# Patient Record
Sex: Female | Born: 1970 | Race: Black or African American | Hispanic: No | Marital: Married | Smoking: Current every day smoker
Health system: Southern US, Community
[De-identification: ages and names within clinical notes are randomized; demographics above are authoritative.]

## PROBLEM LIST (undated history)

## (undated) DIAGNOSIS — I10 Essential (primary) hypertension: Secondary | ICD-10-CM

## (undated) DIAGNOSIS — E876 Hypokalemia: Secondary | ICD-10-CM

## (undated) HISTORY — PX: GASTRIC BYPASS: SHX52

---

## 1997-04-25 ENCOUNTER — Ambulatory Visit (HOSPITAL_COMMUNITY): Admission: RE | Admit: 1997-04-25 | Discharge: 1997-04-25 | Payer: Self-pay | Admitting: *Deleted

## 1997-06-11 ENCOUNTER — Ambulatory Visit (HOSPITAL_COMMUNITY): Admission: RE | Admit: 1997-06-11 | Discharge: 1997-06-11 | Payer: Self-pay | Admitting: *Deleted

## 1997-08-15 ENCOUNTER — Ambulatory Visit (HOSPITAL_COMMUNITY): Admission: RE | Admit: 1997-08-15 | Discharge: 1997-08-15 | Payer: Self-pay | Admitting: *Deleted

## 1997-10-13 ENCOUNTER — Inpatient Hospital Stay (HOSPITAL_COMMUNITY): Admission: AD | Admit: 1997-10-13 | Discharge: 1997-10-13 | Payer: Self-pay | Admitting: *Deleted

## 1997-10-13 ENCOUNTER — Inpatient Hospital Stay (HOSPITAL_COMMUNITY): Admission: AD | Admit: 1997-10-13 | Discharge: 1997-10-19 | Payer: Self-pay | Admitting: *Deleted

## 2016-08-10 ENCOUNTER — Ambulatory Visit (INDEPENDENT_AMBULATORY_CARE_PROVIDER_SITE_OTHER): Payer: Medicaid Other

## 2016-08-10 ENCOUNTER — Ambulatory Visit (HOSPITAL_COMMUNITY)
Admission: EM | Admit: 2016-08-10 | Discharge: 2016-08-10 | Disposition: A | Discharge status: Home / Self Care | Payer: Medicaid Other | Attending: Internal Medicine | Admitting: Internal Medicine

## 2016-08-10 ENCOUNTER — Encounter (HOSPITAL_COMMUNITY): Payer: Self-pay | Admitting: Emergency Medicine

## 2016-08-10 DIAGNOSIS — S46912A Strain of unspecified muscle, fascia and tendon at shoulder and upper arm level, left arm, initial encounter: Secondary | ICD-10-CM

## 2016-08-10 DIAGNOSIS — W19XXXA Unspecified fall, initial encounter: Secondary | ICD-10-CM

## 2016-08-10 HISTORY — DX: Essential (primary) hypertension: I10

## 2016-08-10 MED ORDER — KETOROLAC TROMETHAMINE 30 MG/ML IJ SOLN
INTRAMUSCULAR | Status: AC
  Filled 2016-08-10: qty 1

## 2016-08-10 MED ORDER — KETOROLAC TROMETHAMINE 30 MG/ML IJ SOLN
30.0000 mg | Freq: Once | INTRAMUSCULAR | Status: AC
  Administered 2016-08-10: 30 mg via INTRAMUSCULAR

## 2016-08-10 MED ORDER — CYCLOBENZAPRINE HCL 10 MG PO TABS: 10.0000 mg | ORAL_TABLET | Freq: Two times a day (BID) | ORAL | 0 refills | Status: DC | PRN

## 2016-08-10 NOTE — ED Provider Notes (Signed)
[facility]    CSN: [id number] Arrival date & time: 08/10/16  1016     History   Chief Complaint Chief Complaint  Patient presents with  . Shoulder Pain    HPI Dana Morse is a 46 y.o. female.   Pt fell yesterday onto left shoulder.  Now has difficulty moving left arm without pain. Also difficulty turning head to the left. Denies numbness or tingling in neck, arm or hand.       Past Medical History:  Diagnosis Date  . Hypertension     There are no active problems to display for this patient.   History reviewed. No pertinent surgical history.  OB History    No data available       Home Medications    Prior to Admission medications   Medication Sig Start Date End Date Taking? Authorizing Provider  BuPROPion HCl (WELLBUTRIN PO) Take by mouth.   Yes [provider]  HYDROCHLOROTHIAZIDE PO Take by mouth.   Yes [provider]  cyclobenzaprine (FLEXERIL) 10 MG tablet Take 1 tablet (10 mg total) by mouth 2 (two) times daily as needed for muscle spasms. 08/10/16   Arnaldo Nataliamond, Ariely Riddell S, MD    Family History No family history on file.  Social History Social History  Substance Use Topics  . Smoking status: Current Every Day Smoker  . Smokeless tobacco: Not on file  . Alcohol use No     Allergies   Patient has no known allergies.   Review of Systems Review of Systems  Constitutional: Negative for chills and fever.  HENT: Negative for sore throat and tinnitus.   Eyes: Negative for redness.  Respiratory: Negative for cough and shortness of breath.   Cardiovascular: Negative for chest pain and palpitations.  Gastrointestinal: Negative for abdominal pain, diarrhea, nausea and vomiting.  Genitourinary: Negative for dysuria, frequency and urgency.  Musculoskeletal: Positive for neck stiffness. Negative for myalgias.  Skin: Negative for rash.       No lesions  Neurological: Negative for weakness.  Hematological: Does not  bruise/bleed easily.  Psychiatric/Behavioral: Negative for suicidal ideas.     Physical Exam Triage Vital Signs ED Triage Vitals  Enc Vitals Group     BP 08/10/16 1111 (!) 153/89     Pulse Rate 08/10/16 1111 83     Resp 08/10/16 1111 18     Temp 08/10/16 1111 99 F (37.2 C)     Temp Source 08/10/16 1111 Oral     SpO2 08/10/16 1111 100 %     Weight --      Height --      Head Circumference --      Peak Flow --      Pain Score 08/10/16 1106 9     Pain Loc --      Pain Edu? --      Excl. in GC? --    No data found.   Updated Vital Signs BP (!) 153/89 (BP Location: Right Arm)   Pulse 83   Temp 99 F (37.2 C) (Oral)   Resp 18   SpO2 100%   Visual Acuity Right Eye Distance:   Left Eye Distance:   Bilateral Distance:    Right Eye Near:   Left Eye Near:    Bilateral Near:     Physical Exam  Constitutional: She is oriented to person, place, and time. She appears well-developed and well-nourished. No distress.  HENT:  Head: Normocephalic and atraumatic.  Mouth/Throat: Oropharynx is  clear and moist.  Eyes: Conjunctivae and EOM are normal. Pupils are equal, round, and reactive to light. No scleral icterus.  Neck: Normal range of motion. Neck supple. No JVD present. No tracheal deviation present. No thyromegaly present.  Cardiovascular: Normal rate, regular rhythm and normal heart sounds.  Exam reveals no gallop and no friction rub.   No murmur heard. Pulmonary/Chest: Effort normal and breath sounds normal.  Abdominal: Soft. Bowel sounds are normal. She exhibits no distension. There is no tenderness.  Musculoskeletal: Normal range of motion. She exhibits tenderness (along superior aspect of left shoulder/trapezius). She exhibits no edema.  Difficulty ABducting left arm due to pain. No crepitus  Lymphadenopathy:    She has no cervical adenopathy.  Neurological: She is alert and oriented to person, place, and time. No cranial nerve deficit.  Left grip strength normal    Skin: Skin is warm and dry.  Psychiatric: She has a normal mood and affect. Her behavior is normal. Judgment and thought content normal.  Nursing note and vitals reviewed.    UC Treatments / Results  Labs (all labs ordered are listed, but only abnormal results are displayed) Labs Reviewed - No data to display  EKG  EKG Interpretation None       Radiology No results found.  Procedures Procedures (including critical care time)  Medications Ordered in UC Medications  ketorolac (TORADOL) 30 MG/ML injection 30 mg (not administered)     Initial Impression / Assessment and Plan / UC Course  I have reviewed the triage vital signs and the nursing notes.  Pertinent labs & imaging results that were available during my care of the patient were reviewed by me and considered in my medical decision making (see chart for details).     No evidence of neurological damage. 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  Encourage physiotherapy.   Final Clinical Impressions(s) / UC Diagnoses   Final diagnoses:  Strain of left shoulder, initial encounter    New Prescriptions New Prescriptions   CYCLOBENZAPRINE (FLEXERIL) 10 MG TABLET    Take 1 tablet (10 mg total) by mouth 2 (two) times daily as needed for muscle spasms.     Arnaldo Natal, MD 08/10/16 1249

## 2016-08-10 NOTE — ED Triage Notes (Signed)
Pain in left shoulder started yesterday.  Fell from scaffolding yesterday, was pressure washing.  approx height of scaffolding was 2 feet.  Landed on ground on feet, then landed on right shoulder.  Today pain in left shoulder is much worse.  Turning head to the left is painful.  Left radial pulses 2 +.  Able to move fingers, brisk cap refill.  "hurts, but im stiff"

## 2016-08-24 ENCOUNTER — Ambulatory Visit (HOSPITAL_COMMUNITY)
Admission: EM | Admit: 2016-08-24 | Discharge: 2016-08-24 | Disposition: A | Discharge status: Home / Self Care | Payer: Medicaid Other | Attending: Family Medicine | Admitting: Family Medicine

## 2016-08-24 ENCOUNTER — Encounter (HOSPITAL_COMMUNITY): Payer: Self-pay | Admitting: *Deleted

## 2016-08-24 DIAGNOSIS — M436 Torticollis: Secondary | ICD-10-CM | POA: Diagnosis not present

## 2016-08-24 DIAGNOSIS — M5489 Other dorsalgia: Secondary | ICD-10-CM

## 2016-08-24 MED ORDER — TRIAMCINOLONE ACETONIDE 40 MG/ML IJ SUSP
INTRAMUSCULAR | Status: AC
  Filled 2016-08-24: qty 1

## 2016-08-24 MED ORDER — METHOCARBAMOL 500 MG PO TABS: 500.0000 mg | ORAL_TABLET | Freq: Two times a day (BID) | ORAL | 0 refills | Status: DC

## 2016-08-24 MED ORDER — ACETAMINOPHEN 500 MG PO TABS: 500.0000 mg | ORAL_TABLET | Freq: Four times a day (QID) | ORAL | 0 refills | Status: DC | PRN

## 2016-08-24 MED ORDER — DICLOFENAC SODIUM 75 MG PO TBEC: 75.0000 mg | DELAYED_RELEASE_TABLET | Freq: Two times a day (BID) | ORAL | 0 refills | Status: DC

## 2016-08-24 MED ORDER — BUPIVACAINE HCL (PF) 0.5 % IJ SOLN
INTRAMUSCULAR | Status: AC
  Filled 2016-08-24: qty 10

## 2016-08-24 NOTE — ED Triage Notes (Signed)
Pt  Seen  sev  Weeks  Ago  For  Neck /  Shoulder  Pain  Pt  Reports  Pain   Worse  On movement  And  Position      Pt   Reports       Neck  Pain   Is  Worse  On movement   And  Position    Pt   Denies   Any  Recent  Injury

## 2016-08-24 NOTE — ED Provider Notes (Signed)
CSN: [id number]     Arrival date & time 08/24/16  1321 History   First MD Initiated Contact with Patient 08/24/16 1403     Chief Complaint  Patient presents with  . Torticollis   (Consider location/radiation/quality/duration/timing/severity/associated sxs/prior Treatment) Dana Morse is a 46 y.o. female who [facility] with a chief complaint of neck and shoulder pain ongoing now for more than 2 weeks, was seen here in the urgent care on 08/10/2016 for a similar condition, was prescribed Flexeril with minimal relief.   The history is provided by the patient.  Back Pain  Pain location: neck and shoulder. Quality:  Aching and cramping Radiates to:  Does not radiate Pain severity:  Moderate Pain is:  Same all the time Onset quality:  Gradual Duration:  2 weeks Timing:  Constant Progression:  Worsening Chronicity:  New Context: not falling, not jumping from heights, not MCA, not MVA and not twisting   Relieved by:  Muscle relaxants and being still Worsened by:  Movement Ineffective treatments:  NSAIDs and muscle relaxants Associated symptoms: no abdominal pain, no abdominal swelling, no bladder incontinence, no bowel incontinence, no fever, no headaches, no numbness and no tingling     Past Medical History:  Diagnosis Date  . Hypertension    History reviewed. No pertinent surgical history. History reviewed. No pertinent family history. Social History  Substance Use Topics  . Smoking status: Current Every Day Smoker  . Smokeless tobacco: Not on file  . Alcohol use No   OB History    No data available     Review of Systems  Constitutional: Negative for chills and fever.  HENT: Negative.   Respiratory: Negative.   Cardiovascular: Negative.   Gastrointestinal: Negative for abdominal pain, bowel incontinence, diarrhea, nausea and vomiting.  Genitourinary: Negative for bladder incontinence.  Musculoskeletal: Positive for back pain.   Shoulder and neck pain  Skin: Negative.   Neurological: Negative for tingling, numbness and headaches.    Allergies  Patient has no known allergies.  Home Medications   Prior to Admission medications   Medication Sig Start Date End Date Taking? Authorizing Provider  acetaminophen (TYLENOL) 500 MG tablet Take 1 tablet (500 mg total) by mouth every 6 (six) hours as needed. 08/24/16   Dorena BodoKennard, Tyrelle Raczka, NP  BuPROPion HCl (WELLBUTRIN PO) Take by mouth.    [provider]  diclofenac (VOLTAREN) 75 MG EC tablet Take 1 tablet (75 mg total) by mouth 2 (two) times daily. 08/24/16   Dorena BodoKennard, Estera Ozier, NP  HYDROCHLOROTHIAZIDE PO Take by mouth.    [provider]  methocarbamol (ROBAXIN) 500 MG tablet Take 1 tablet (500 mg total) by mouth 2 (two) times daily. 08/24/16   Dorena BodoKennard, Keelynn Furgerson, NP   Meds Ordered and Administered this Visit  Medications - No data to display  BP 132/78 (BP Location: Right Arm)   Pulse 78   Temp 98.6 F (37 C) (Oral)   Resp 18   SpO2 100%  No data found.   Physical Exam  Constitutional: She is oriented to person, place, and time. She appears well-developed and well-nourished. No distress.  HENT:  Head: Normocephalic and atraumatic.  Right Ear: External ear normal.  Left Ear: External ear normal.  Eyes: Conjunctivae are normal.  Cardiovascular: Normal rate and regular rhythm.   Pulmonary/Chest: Effort normal and breath sounds normal.  Musculoskeletal:       Cervical back: She exhibits spasm.       Back:  Neurological: She is alert and oriented to person, place, and time.  Skin: Skin is warm and dry. Capillary refill takes less than 2 seconds. No rash noted. She is not diaphoretic. No erythema.  Psychiatric: She has a normal mood and affect. Her behavior is normal.  Nursing note and vitals reviewed.   Urgent Care Course     .Joint Aspiration/Arthrocentesis Date/Time: 08/24/2016 2:25 PM Performed by: Dorena Bodo Authorized by:  Mardella Layman   Consent:    Consent obtained:  Verbal   Consent given by:  Patient   Risks discussed:  Bleeding, infection, nerve damage and pain   Alternatives discussed:  Delayed treatment, alternative treatment, no treatment and referral Location:    Location:  Shoulder   Shoulder joint: trapezius muscle. Anesthesia (see MAR for exact dosages):    Anesthesia method:  Local infiltration   Local anesthetic:  Bupivacaine 0.5% w/o epi and lidocaine 2% w/o epi Procedure details:    Needle gauge:  22 G   Ultrasound guidance: no     Approach:  Posterior   Aspirate amount:  0 cc   Steroid injected: yes   Post-procedure details:    Dressing:  Adhesive bandage   Patient tolerance of procedure:  Tolerated well, no immediate complications Comments:     Trigger point injection with mix of kenalog, marcaine and lidocaine     (including critical care time)  Labs Review Labs Reviewed - No data to display  Imaging Review No results found.     MDM   1. Torticollis     Trigger point injection provided in the left trapezius muscle at the side of the spasm, given mixture of Kenalog, bupivacaine, lidocaine. Discharged home on prescription of Robaxin, and diclofenac. Follow-up with primary care, return as necessary.     Dorena Bodo, NP 08/24/16 [phone]

## 2016-08-24 NOTE — Discharge Instructions (Signed)
You have received a trigger point injection for ear pain, I switched to her muscle relaxer from Flexeril to Robaxin. Given prescription of diclofenac and Tylenol for pain. Recommend rest, ice, alternating with heat as necessary, follow-up with primary care provider if pain persists.

## 2016-09-07 ENCOUNTER — Encounter: Payer: Medicaid Other | Admitting: Internal Medicine

## 2016-11-26 DIAGNOSIS — E6609 Other obesity due to excess calories: Secondary | ICD-10-CM | POA: Diagnosis not present

## 2016-11-26 DIAGNOSIS — I10 Essential (primary) hypertension: Secondary | ICD-10-CM | POA: Diagnosis not present

## 2016-11-26 DIAGNOSIS — F339 Major depressive disorder, recurrent, unspecified: Secondary | ICD-10-CM | POA: Diagnosis not present

## 2017-02-25 DIAGNOSIS — N939 Abnormal uterine and vaginal bleeding, unspecified: Secondary | ICD-10-CM | POA: Diagnosis not present

## 2017-02-26 ENCOUNTER — Other Ambulatory Visit (HOSPITAL_COMMUNITY)
Admission: RE | Admit: 2017-02-26 | Discharge: 2017-02-26 | Disposition: A | Discharge status: Home / Self Care | Payer: BLUE CROSS/BLUE SHIELD | Source: Ambulatory Visit | Attending: Family Medicine | Admitting: Family Medicine

## 2017-02-26 ENCOUNTER — Other Ambulatory Visit: Payer: Self-pay | Admitting: Family Medicine

## 2017-02-26 DIAGNOSIS — Z124 Encounter for screening for malignant neoplasm of cervix: Secondary | ICD-10-CM | POA: Insufficient documentation

## 2017-03-05 LAB — CYTOLOGY - PAP

## 2017-05-25 DIAGNOSIS — I10 Essential (primary) hypertension: Secondary | ICD-10-CM | POA: Diagnosis not present

## 2017-05-25 DIAGNOSIS — D508 Other iron deficiency anemias: Secondary | ICD-10-CM | POA: Diagnosis not present

## 2017-05-25 DIAGNOSIS — F339 Major depressive disorder, recurrent, unspecified: Secondary | ICD-10-CM | POA: Diagnosis not present

## 2017-05-25 DIAGNOSIS — E6609 Other obesity due to excess calories: Secondary | ICD-10-CM | POA: Diagnosis not present

## 2017-09-12 ENCOUNTER — Encounter (HOSPITAL_COMMUNITY): Payer: Self-pay | Admitting: Emergency Medicine

## 2017-09-12 ENCOUNTER — Emergency Department (HOSPITAL_COMMUNITY)
Admission: EM | Admit: 2017-09-12 | Discharge: 2017-09-12 | Disposition: A | Discharge status: Home / Self Care | Payer: BLUE CROSS/BLUE SHIELD | Attending: Emergency Medicine | Admitting: Emergency Medicine

## 2017-09-12 ENCOUNTER — Emergency Department (HOSPITAL_COMMUNITY): Payer: BLUE CROSS/BLUE SHIELD

## 2017-09-12 ENCOUNTER — Other Ambulatory Visit: Payer: Self-pay

## 2017-09-12 DIAGNOSIS — R109 Unspecified abdominal pain: Secondary | ICD-10-CM | POA: Diagnosis not present

## 2017-09-12 DIAGNOSIS — R197 Diarrhea, unspecified: Secondary | ICD-10-CM | POA: Diagnosis not present

## 2017-09-12 DIAGNOSIS — F17210 Nicotine dependence, cigarettes, uncomplicated: Secondary | ICD-10-CM | POA: Insufficient documentation

## 2017-09-12 DIAGNOSIS — R1084 Generalized abdominal pain: Secondary | ICD-10-CM | POA: Diagnosis not present

## 2017-09-12 DIAGNOSIS — I10 Essential (primary) hypertension: Secondary | ICD-10-CM | POA: Insufficient documentation

## 2017-09-12 DIAGNOSIS — Z79899 Other long term (current) drug therapy: Secondary | ICD-10-CM | POA: Diagnosis not present

## 2017-09-12 DIAGNOSIS — Z9884 Bariatric surgery status: Secondary | ICD-10-CM | POA: Diagnosis not present

## 2017-09-12 DIAGNOSIS — K921 Melena: Secondary | ICD-10-CM | POA: Diagnosis not present

## 2017-09-12 DIAGNOSIS — R195 Other fecal abnormalities: Secondary | ICD-10-CM | POA: Insufficient documentation

## 2017-09-12 LAB — CBC
HCT: 33.8 % — ABNORMAL LOW (ref 36.0–46.0)
Hemoglobin: 9.9 g/dL — ABNORMAL LOW (ref 12.0–15.0)
MCH: 23.2 pg — AB (ref 26.0–34.0)
MCHC: 29.3 g/dL — ABNORMAL LOW (ref 30.0–36.0)
MCV: 79.3 fL (ref 78.0–100.0)
PLATELETS: 348 10*3/uL (ref 150–400)
RBC: 4.26 MIL/uL (ref 3.87–5.11)
RDW: 17 % — ABNORMAL HIGH (ref 11.5–15.5)
WBC: 6.8 10*3/uL (ref 4.0–10.5)

## 2017-09-12 LAB — COMPREHENSIVE METABOLIC PANEL
ALBUMIN: 3.6 g/dL (ref 3.5–5.0)
ALK PHOS: 79 U/L (ref 38–126)
ALT: 21 U/L (ref 0–44)
AST: 30 U/L (ref 15–41)
Anion gap: 9 (ref 5–15)
BUN: 10 mg/dL (ref 6–20)
CALCIUM: 9.5 mg/dL (ref 8.9–10.3)
CO2: 27 mmol/L (ref 22–32)
CREATININE: 0.87 mg/dL (ref 0.44–1.00)
Chloride: 105 mmol/L (ref 98–111)
GFR calc Af Amer: >60 mL/min (ref >60)
GFR calc non Af Amer: >60 mL/min (ref >60)
GLUCOSE: 94 mg/dL (ref 70–99)
Potassium: 3.2 mmol/L — ABNORMAL LOW (ref 3.5–5.1)
SODIUM: 141 mmol/L (ref 135–145)
Total Bilirubin: 0.5 mg/dL (ref 0.3–1.2)
Total Protein: 7.9 g/dL (ref 6.5–8.1)

## 2017-09-12 LAB — URINALYSIS, ROUTINE W REFLEX MICROSCOPIC
BILIRUBIN URINE: NEGATIVE
GLUCOSE, UA: NEGATIVE mg/dL
HGB URINE DIPSTICK: NEGATIVE
Ketones, ur: NEGATIVE mg/dL
Leukocytes, UA: NEGATIVE
Nitrite: NEGATIVE
Protein, ur: NEGATIVE mg/dL
Specific Gravity, Urine: 1.02 (ref 1.005–1.030)
pH: 6 (ref 5.0–8.0)

## 2017-09-12 LAB — I-STAT BETA HCG BLOOD, ED (MC, WL, AP ONLY): I-stat hCG, quantitative: <5 m[IU]/mL (ref <5)

## 2017-09-12 LAB — POC OCCULT BLOOD, ED: Fecal Occult Bld: NEGATIVE

## 2017-09-12 LAB — LIPASE, BLOOD: Lipase: 57 U/L — ABNORMAL HIGH (ref 11–51)

## 2017-09-12 MED ORDER — ONDANSETRON 4 MG PO TBDP: 4.0000 mg | ORAL_TABLET | Freq: Three times a day (TID) | ORAL | 0 refills | Status: DC | PRN

## 2017-09-12 MED ORDER — SODIUM CHLORIDE 0.9 % IV BOLUS
500.0000 mL | Freq: Once | INTRAVENOUS | Status: AC
  Administered 2017-09-12: 500 mL via INTRAVENOUS

## 2017-09-12 MED ORDER — HYDROMORPHONE HCL 1 MG/ML IJ SOLN
0.5000 mg | Freq: Once | INTRAMUSCULAR | Status: AC
  Administered 2017-09-12: 0.5 mg via INTRAVENOUS
  Filled 2017-09-12: qty 1

## 2017-09-12 MED ORDER — DICYCLOMINE HCL 20 MG PO TABS: 20.0000 mg | ORAL_TABLET | Freq: Two times a day (BID) | ORAL | 0 refills | Status: AC

## 2017-09-12 MED ORDER — ONDANSETRON HCL 4 MG/2ML IJ SOLN
4.0000 mg | Freq: Once | INTRAMUSCULAR | Status: AC
  Administered 2017-09-12: 4 mg via INTRAVENOUS
  Filled 2017-09-12: qty 2

## 2017-09-12 MED ORDER — IOHEXOL 300 MG/ML  SOLN
100.0000 mL | Freq: Once | INTRAMUSCULAR | Status: AC | PRN
  Administered 2017-09-12: 100 mL via INTRAVENOUS

## 2017-09-12 NOTE — Discharge Instructions (Signed)
Please read and follow all provided instructions.  Your diagnoses today include:  1. Abdominal cramps   2. Blood in stool     Tests performed today include:  Blood counts and electrolytes  Blood tests to check liver and kidney function  Blood tests to check pancreas function  Urine test to look for infection  CT scan of the abdomen -no concerning findings or explanation for your pain.  Stool test for blood -was negative  Vital signs. See below for your results today.   Medications prescribed:   Bentyl - medication for intestinal cramps and spasms   Zofran (ondansetron) - for nausea and vomiting  Take any prescribed medications only as directed.  Home care instructions:   Follow any educational materials contained in this packet.  Follow-up instructions: Please follow-up with your primary care provider in the next 3 days for further evaluation of your symptoms.    Return instructions:  SEEK IMMEDIATE MEDICAL ATTENTION IF:  The pain does not go away or becomes severe   A temperature above 101F develops   Repeated vomiting occurs (multiple episodes)   The pain becomes localized to portions of the abdomen. The right side could possibly be appendicitis. In an adult, the left lower portion of the abdomen could be colitis or diverticulitis.   Larger amounts of blood is being passed in stools or vomit (bright red or black tarry stools)   You develop chest pain, difficulty breathing, dizziness or fainting, or become confused, poorly responsive, or inconsolable (young children)  If you have any other emergent concerns regarding your health  Additional Information: Abdominal (belly) pain can be caused by many things. Your caregiver performed an examination and possibly ordered blood/urine tests and imaging (CT scan, x-rays, ultrasound). Many cases can be observed and treated at home after initial evaluation in the emergency department. Even though you are being  discharged home, abdominal pain can be unpredictable. Therefore, you need a repeated exam if your pain does not resolve, returns, or worsens. Most patients with abdominal pain don't have to be admitted to the hospital or have surgery, but serious problems like appendicitis and gallbladder attacks can start out as nonspecific pain. Many abdominal conditions cannot be diagnosed in one visit, so follow-up evaluations are very important.  Your vital signs today were: BP 133/83    Pulse 82    Temp 98.7 F (37.1 C) (Oral)    Resp 14    LMP 07/08/2015 (Approximate)    SpO2 100%  If your blood pressure (bp) was elevated above 135/85 this visit, please have this repeated by your doctor within one month. --------------

## 2017-09-12 NOTE — ED Triage Notes (Signed)
Patient to ED c/o lower-mid abdominal "burning" and cramping x 3 days with nausea and diarrhea. Patient states the cramping radiates to her back intermittently. Patient reports today seeing blood in the toilet after having bowel movement. Denies urinary symptoms, vomiting, fevers.

## 2017-09-12 NOTE — ED Notes (Signed)
Patient transported to CT 

## 2017-09-12 NOTE — ED Provider Notes (Signed)
[facility] Provider Note   CSN: [id number] Arrival date & time: 09/12/17  1158     History   Chief Complaint Chief Complaint  Patient presents with  . Abdominal Pain    HPI Dana Morse is a 47 y.o. female.  Patient with past history of gastric bypass surgery (she does not remember the specific type) presents to the emergency department with generalized abdominal cramping and "burning" pain over the past 3 days.  Pain is on the "front and back".  This is been associated with nausea but no vomiting.  She has also had diarrhea.  Patient has noted blood in the toilet water after having bowel movements.  She denies fevers, chest pain, shortness of breath.  She denies dysuria, hematuria, increased frequency urgency.  She is been taking over-the-counter medications without much improvement.  No other abdominal surgeries.  The onset of this condition was acute. The course is waxing and waning. Aggravating factors: none. Alleviating factors: none.       Past Medical History:  Diagnosis Date  . Hypertension     There are no active problems to display for this patient.   History reviewed. No pertinent surgical history.   OB History   None      Home Medications    Prior to Admission medications   Medication Sig Start Date End Date Taking? Authorizing Provider  buPROPion (WELLBUTRIN XL) 150 MG 24 hr tablet Take 150 mg by mouth daily. 08/19/17  Yes [provider]  capsicum (ZOSTRIX) 0.075 % topical cream Apply 1 application topically as needed (foot pain).   Yes [provider]  diclofenac sodium (VOLTAREN) 1 % GEL Apply 2 g topically 2 (two) times daily as needed (foot and knee pain).   Yes [provider]  hydrochlorothiazide (HYDRODIURIL) 25 MG tablet Take 25 mg by mouth daily. 08/19/17  Yes [provider]  acetaminophen (TYLENOL) 500 MG tablet Take 1 tablet (500 mg total) by mouth every 6 (six) hours as  needed. Patient not taking: Reported on 09/12/2017 08/24/16   Dorena BodoKennard, Lawrence, NP  diclofenac (VOLTAREN) 75 MG EC tablet Take 1 tablet (75 mg total) by mouth 2 (two) times daily. Patient not taking: Reported on 09/12/2017 08/24/16   Dorena BodoKennard, Lawrence, NP  methocarbamol (ROBAXIN) 500 MG tablet Take 1 tablet (500 mg total) by mouth 2 (two) times daily. Patient not taking: Reported on 09/12/2017 08/24/16   Dorena BodoKennard, Lawrence, NP    Family History No family history on file.  Social History Social History   Tobacco Use  . Smoking status: Current Every Day Smoker    Packs/day: 0.25    Types: Cigarettes  . Smokeless tobacco: Never Used  Substance Use Topics  . Alcohol use: Yes    Comment: rare  . Drug use: No     Allergies   Patient has no known allergies.   Review of Systems Review of Systems  Constitutional: Negative for fever.  HENT: Negative for rhinorrhea and sore throat.   Eyes: Negative for redness.  Respiratory: Negative for cough.   Cardiovascular: Negative for chest pain.  Gastrointestinal: Positive for abdominal pain, blood in stool, diarrhea and nausea. Negative for vomiting.  Genitourinary: Negative for dysuria.  Musculoskeletal: Negative for myalgias.  Skin: Negative for rash.  Neurological: Negative for headaches.     Physical Exam Updated Vital Signs BP 133/83   Pulse 82   Temp 98.7 F (37.1 C) (Oral)   Resp 14   LMP 07/08/2015 (  Approximate)   SpO2 100%   Physical Exam  Constitutional: She appears well-developed and well-nourished.  HENT:  Head: Normocephalic and atraumatic.  Eyes: Conjunctivae are normal. Right eye exhibits no discharge. Left eye exhibits no discharge.  Neck: Normal range of motion. Neck supple.  Cardiovascular: Normal rate, regular rhythm and normal heart sounds.  Pulmonary/Chest: Effort normal and breath sounds normal.  Abdominal: Soft. There is generalized tenderness (Generalized, mild).  Genitourinary: Rectal exam shows no  external hemorrhoid, no internal hemorrhoid and guaiac negative stool.  Neurological: She is alert.  Skin: Skin is warm and dry.  Psychiatric: She has a normal mood and affect.  Nursing note and vitals reviewed.    ED Treatments / Results  Labs (all labs ordered are listed, but only abnormal results are displayed) Labs Reviewed  LIPASE, BLOOD - Abnormal; Notable for the following components:      Result Value   Lipase 57 (*)    All other components within normal limits  COMPREHENSIVE METABOLIC PANEL - Abnormal; Notable for the following components:   Potassium 3.2 (*)    All other components within normal limits  CBC - Abnormal; Notable for the following components:   Hemoglobin 9.9 (*)    HCT 33.8 (*)    MCH 23.2 (*)    MCHC 29.3 (*)    RDW 17.0 (*)    All other components within normal limits  URINALYSIS, ROUTINE W REFLEX MICROSCOPIC  I-STAT BETA HCG BLOOD, ED ([facility], [facility], [facility] ONLY)  POC OCCULT BLOOD, ED    EKG None  Radiology Ct Abdomen Pelvis W Contrast  Result Date: 09/12/2017 CLINICAL DATA:  Lower-mid abdominal burning and cramping for 3 days with nausea and diarrhea. Cramping radiates to back intermittently. Bloody stools. EXAM: CT ABDOMEN AND PELVIS WITH CONTRAST TECHNIQUE: Multidetector CT imaging of the abdomen and pelvis was performed using the standard protocol following bolus administration of intravenous contrast. CONTRAST:  OMNIPAQUE IOHEXOL 300 MG/ML  SOLN COMPARISON:  None. FINDINGS: Lower chest: No acute abnormality. Hepatobiliary: No acute or suspicious liver abnormality is seen. Focal fatty infiltration within the LEFT liver lobe. No gallstones, gallbladder wall thickening, or biliary dilatation. Pancreas: Unremarkable. No pancreatic ductal dilatation or surrounding inflammatory changes. Spleen: Normal in size without focal abnormality. Adrenals/Urinary Tract: Kidneys are unremarkable without suspicious mass, stone or hydronephrosis. Adrenal glands appear  normal. No ureteral or bladder calculi identified. Bladder appears normal, partially decompressed. Stomach/Bowel: No dilated large or small bowel loops. No bowel wall thickening or evidence of bowel wall inflammation. Appendix is normal. Surgical changes of gastric bypass, without obstruction or other surgical complicating feature. Vascular/Lymphatic: No significant vascular findings are present. No enlarged abdominal or pelvic lymph nodes. Reproductive: Uterus and bilateral adnexa are unremarkable. Other: No free fluid or abscess collection. No free intraperitoneal air. Musculoskeletal: No acute or suspicious osseous finding. IMPRESSION: 1. No acute findings within the abdomen or pelvis. No bowel obstruction or evidence of bowel wall inflammation. No free fluid. No free intraperitoneal air. No renal or ureteral calculi. Appendix is normal. 2. Surgical changes of gastric bypass. Electronically Signed   By: Bary Richard M.D.   On: 09/12/2017 15:40    Procedures Procedures (including critical care time)  Medications Ordered in ED Medications  HYDROmorphone (DILAUDID) injection 0.5 mg (0.5 mg Intravenous Given 09/12/17 1426)  ondansetron (ZOFRAN) injection 4 mg (4 mg Intravenous Given 09/12/17 1423)  sodium chloride 0.9 % bolus 500 mL (500 mLs Intravenous New Bag/Given 09/12/17 1423)  iohexol (OMNIPAQUE) 300 MG/ML  solution 100 mL (100 mLs Intravenous Contrast Given 09/12/17 1504)     Initial Impression / Assessment and Plan / ED Course  I have reviewed the triage vital signs and the nursing notes.  Pertinent labs & imaging results that were available during my care of the patient were reviewed by me and considered in my medical decision making (see chart for details).     Patient seen and examined.  Rectal exam performed with RN chaperone.  Guaiac negative.  Will check CT exam given bleeding in setting of abdominal cramping to evaluate for diverticulitis or colitis.  Vital signs reviewed and are as  follows: BP (!) 123/59   Pulse 79   Temp 98.7 F (37.1 C) (Oral)   Resp 16   LMP 07/08/2015 (Approximate)   SpO2 100%   Patient's pain and symptoms improved with treatment here in emergency department.  We reviewed the CT results at bedside.  No acute findings, need for admission, need for antibiotics at this time.  Patient notified of her mild anemia and the need to have this rechecked.  She will call Dr. Parke Simmers tomorrow for an appointment this week.  Patient is comfortable with discharged home.  We will discharged home with Zofran and Bentyl for symptom control.  The patient was urged to return to the Emergency Department immediately with worsening of current symptoms, worsening abdominal pain, persistent vomiting, blood noted in stools, fever, or any other concerns. The patient verbalized understanding.    Final Clinical Impressions(s) / ED Diagnoses   Final diagnoses:  Abdominal cramps  Blood in stool   Patient with abdominal cramping and reported blood in the stool.  Mild anemia noted but guaiac negative without gross blood on exam.  Her abdomen does not have any focal tenderness.  CT is reassuring.  Slightly elevated lipase but no other signs of pancreatitis.  She is not vomiting.  Mild hypokalemia noted.  Patient encouraged to maintain good oral intake.  Symptoms controlled here in emergency department she appears well.  Vital signs within normal limits. No signs of dehydration, patient is tolerating PO's. Lungs are clear and no signs suggestive of PNA. Low concern for appendicitis, cholecystitis, pancreatitis, ruptured viscus, UTI, kidney stone, aortic dissection, aortic aneurysm or other emergent abdominal etiology. Supportive therapy indicated with return if symptoms worsen.    ED Discharge Orders        Ordered    dicyclomine (BENTYL) 20 MG tablet  2 times daily     09/12/17 1611    ondansetron (ZOFRAN ODT) 4 MG disintegrating tablet  Every 8 hours PRN     09/12/17 1611         Renne Crigler, PA-C 09/12/17 1630    Margarita Grizzle, MD 09/12/17 [phone]

## 2017-10-04 DIAGNOSIS — Z1231 Encounter for screening mammogram for malignant neoplasm of breast: Secondary | ICD-10-CM | POA: Diagnosis not present

## 2017-10-14 DIAGNOSIS — Z6836 Body mass index (BMI) 36.0-36.9, adult: Secondary | ICD-10-CM | POA: Diagnosis not present

## 2017-10-14 DIAGNOSIS — F339 Major depressive disorder, recurrent, unspecified: Secondary | ICD-10-CM | POA: Diagnosis not present

## 2017-10-14 DIAGNOSIS — D508 Other iron deficiency anemias: Secondary | ICD-10-CM | POA: Diagnosis not present

## 2017-10-14 DIAGNOSIS — I10 Essential (primary) hypertension: Secondary | ICD-10-CM | POA: Diagnosis not present

## 2017-10-23 DIAGNOSIS — J069 Acute upper respiratory infection, unspecified: Secondary | ICD-10-CM | POA: Diagnosis not present

## 2017-10-23 DIAGNOSIS — D508 Other iron deficiency anemias: Secondary | ICD-10-CM | POA: Diagnosis not present

## 2017-10-23 DIAGNOSIS — I10 Essential (primary) hypertension: Secondary | ICD-10-CM | POA: Diagnosis not present

## 2017-11-18 DIAGNOSIS — I10 Essential (primary) hypertension: Secondary | ICD-10-CM | POA: Diagnosis not present

## 2017-11-18 DIAGNOSIS — D508 Other iron deficiency anemias: Secondary | ICD-10-CM | POA: Diagnosis not present

## 2017-11-18 DIAGNOSIS — M7732 Calcaneal spur, left foot: Secondary | ICD-10-CM | POA: Diagnosis not present

## 2017-11-18 DIAGNOSIS — F339 Major depressive disorder, recurrent, unspecified: Secondary | ICD-10-CM | POA: Diagnosis not present

## 2017-11-18 DIAGNOSIS — M7731 Calcaneal spur, right foot: Secondary | ICD-10-CM | POA: Diagnosis not present

## 2017-12-25 ENCOUNTER — Ambulatory Visit (INDEPENDENT_AMBULATORY_CARE_PROVIDER_SITE_OTHER): Payer: BLUE CROSS/BLUE SHIELD

## 2017-12-25 ENCOUNTER — Ambulatory Visit (HOSPITAL_COMMUNITY)
Admission: EM | Admit: 2017-12-25 | Discharge: 2017-12-25 | Disposition: A | Discharge status: Home / Self Care | Payer: BLUE CROSS/BLUE SHIELD | Attending: Internal Medicine | Admitting: Internal Medicine

## 2017-12-25 ENCOUNTER — Encounter (HOSPITAL_COMMUNITY): Payer: Self-pay

## 2017-12-25 DIAGNOSIS — M79671 Pain in right foot: Secondary | ICD-10-CM

## 2017-12-25 DIAGNOSIS — M79672 Pain in left foot: Secondary | ICD-10-CM

## 2017-12-25 DIAGNOSIS — I10 Essential (primary) hypertension: Secondary | ICD-10-CM | POA: Diagnosis not present

## 2017-12-25 MED ORDER — KETOROLAC TROMETHAMINE 30 MG/ML IJ SOLN
30.0000 mg | Freq: Once | INTRAMUSCULAR | Status: AC
  Administered 2017-12-25: 30 mg via INTRAMUSCULAR

## 2017-12-25 MED ORDER — KETOROLAC TROMETHAMINE 30 MG/ML IJ SOLN
INTRAMUSCULAR | Status: AC
  Filled 2017-12-25: qty 1

## 2017-12-25 MED ORDER — PREDNISONE 10 MG PO TABS: 20.0000 mg | ORAL_TABLET | Freq: Every day | ORAL | 0 refills | Status: AC

## 2017-12-25 NOTE — Discharge Instructions (Addendum)
Soak foot in epson salts and practice exercises as shown

## 2017-12-25 NOTE — ED Triage Notes (Signed)
Pt states she has heel spurs the pain feels like its stabbing pain. ( both feet )

## 2017-12-25 NOTE — ED Provider Notes (Signed)
[facility]    CSN: [id number] Arrival date & time: 12/25/17  1018     History   Chief Complaint Chief Complaint  Patient presents with  . Foot Pain    HPI Dana Morse is a 47 y.o. female.   47 y.o. female presents with bilateral foot pain right worse than left.  Patient describes the pain as "stabibng" Patient states that she was treated approximately 6 to 7 weeks ago and diagnosed with bone spurs and plantar fasciitis.  Patient reports improvement with prednisone and pain medicine given by primary care doctor.  Her patient states that pain has returned and worsening pain in heel and medial and lateral aspects of both feet. Right is worse Pain is worse in the morning and at night and improves during the day.  Patient is requesting bilateral feet be x-rayed at this time as she states it has "been a while since they were x-rayed. Patein requesting pain medication and steroids. Patient education provided on exercises for plantar fasciitis.        Past Medical History:  Diagnosis Date  . Hypertension     There are no active problems to display for this patient.   History reviewed. No pertinent surgical history.  OB History   None      Home Medications    Prior to Admission medications   Medication Sig Start Date End Date Taking? Authorizing Provider  buPROPion (WELLBUTRIN XL) 150 MG 24 hr tablet Take 150 mg by mouth daily. 08/19/17   [provider]  capsicum (ZOSTRIX) 0.075 % topical cream Apply 1 application topically as needed (foot pain).    [provider]  diclofenac sodium (VOLTAREN) 1 % GEL Apply 2 g topically 2 (two) times daily as needed (foot and knee pain).    [provider]  dicyclomine (BENTYL) 20 MG tablet Take 1 tablet (20 mg total) by mouth 2 (two) times daily. 09/12/17   Renne Crigler, PA-C  hydrochlorothiazide (HYDRODIURIL) 25 MG tablet Take 25 mg by mouth daily. 08/19/17   [provider]  ondansetron  (ZOFRAN ODT) 4 MG disintegrating tablet Take 1 tablet (4 mg total) by mouth every 8 (eight) hours as needed for nausea or vomiting. 09/12/17   Renne Crigler, PA-C    Family History History reviewed. No pertinent family history.  Social History Social History   Tobacco Use  . Smoking status: Current Every Day Smoker    Packs/day: 0.25    Types: Cigarettes  . Smokeless tobacco: Never Used  Substance Use Topics  . Alcohol use: Yes    Comment: rare  . Drug use: No     Allergies   Patient has no known allergies.   Review of Systems Review of Systems  Constitutional: Negative for chills and fever.  HENT: Negative for ear pain and sore throat.   Eyes: Negative for pain and visual disturbance.  Respiratory: Negative for cough and shortness of breath.   Cardiovascular: Negative for chest pain and palpitations.  Gastrointestinal: Negative for abdominal pain and vomiting.  Genitourinary: Negative for dysuria and hematuria.  Musculoskeletal: Negative for arthralgias and back pain.       Pain to both feet  Skin: Negative for color change and rash.  Neurological: Negative for seizures and syncope.  All other systems reviewed and are negative.    Physical Exam Triage Vital Signs ED Triage Vitals  Enc Vitals Group     BP 12/25/17 1128 (!) 152/93     Pulse Rate  12/25/17 1128 68     Resp 12/25/17 1128 16     Temp 12/25/17 1128 98.7 F (37.1 C)     Temp Source 12/25/17 1128 Oral     SpO2 12/25/17 1128 100 %     Weight 12/25/17 1130 210 lb (95.3 kg)     Height --      Head Circumference --      Peak Flow --      Pain Score --      Pain Loc --      Pain Edu? --      Excl. in GC? --    No data found.  Updated Vital Signs BP (!) 152/93 (BP Location: Right Arm)   Pulse 68   Temp 98.7 F (37.1 C) (Oral)   Resp 16   Wt 210 lb (95.3 kg)   SpO2 100%   Visual Acuity Right Eye Distance:   Left Eye Distance:   Bilateral Distance:    Right Eye Near:   Left Eye Near:      Bilateral Near:     Physical Exam  Constitutional: She is oriented to person, place, and time. She appears well-developed and well-nourished.  HENT:  Head: Normocephalic and atraumatic.  Eyes: Conjunctivae are normal.  Neck: Normal range of motion.  Pulmonary/Chest: Effort normal.  Musculoskeletal: Normal range of motion.  Neurological: She is alert and oriented to person, place, and time.  Skin: Skin is warm.  Psychiatric: She has a normal mood and affect.  Nursing note and vitals reviewed.    UC Treatments / Results  Labs (all labs ordered are listed, but only abnormal results are displayed) Labs Reviewed - No data to display  EKG None  Radiology No results found.  Procedures Procedures (including critical care time)  Medications Ordered in UC Medications - No data to display  Initial Impression / Assessment and Plan / UC Course  I have reviewed the triage vital signs and the nursing notes.  Pertinent labs & imaging results that were available during my care of the patient were reviewed by me and considered in my medical decision making (see chart for details).      Final Clinical Impressions(s) / UC Diagnoses   Final diagnoses:  None   Discharge Instructions   None    ED Prescriptions    None     Controlled Substance Prescriptions [place] Controlled Substance Registry consulted? Not Applicable   Alene Mires, NP 12/25/17 [phone]

## 2017-12-31 DIAGNOSIS — E6609 Other obesity due to excess calories: Secondary | ICD-10-CM | POA: Diagnosis not present

## 2017-12-31 DIAGNOSIS — I10 Essential (primary) hypertension: Secondary | ICD-10-CM | POA: Diagnosis not present

## 2018-01-15 DIAGNOSIS — M7731 Calcaneal spur, right foot: Secondary | ICD-10-CM | POA: Diagnosis not present

## 2018-01-15 DIAGNOSIS — E6609 Other obesity due to excess calories: Secondary | ICD-10-CM | POA: Diagnosis not present

## 2018-01-15 DIAGNOSIS — Z6835 Body mass index (BMI) 35.0-35.9, adult: Secondary | ICD-10-CM | POA: Diagnosis not present

## 2018-01-25 DIAGNOSIS — M722 Plantar fascial fibromatosis: Secondary | ICD-10-CM | POA: Diagnosis not present

## 2018-01-25 DIAGNOSIS — M79671 Pain in right foot: Secondary | ICD-10-CM | POA: Diagnosis not present

## 2018-01-25 DIAGNOSIS — M79672 Pain in left foot: Secondary | ICD-10-CM | POA: Diagnosis not present

## 2018-02-15 DIAGNOSIS — M79672 Pain in left foot: Secondary | ICD-10-CM | POA: Diagnosis not present

## 2018-02-15 DIAGNOSIS — E559 Vitamin D deficiency, unspecified: Secondary | ICD-10-CM | POA: Diagnosis not present

## 2018-02-15 DIAGNOSIS — F330 Major depressive disorder, recurrent, mild: Secondary | ICD-10-CM | POA: Diagnosis not present

## 2018-02-15 DIAGNOSIS — M79671 Pain in right foot: Secondary | ICD-10-CM | POA: Diagnosis not present

## 2018-02-15 DIAGNOSIS — D508 Other iron deficiency anemias: Secondary | ICD-10-CM | POA: Diagnosis not present

## 2018-02-15 DIAGNOSIS — M722 Plantar fascial fibromatosis: Secondary | ICD-10-CM | POA: Diagnosis not present

## 2018-02-15 DIAGNOSIS — R7303 Prediabetes: Secondary | ICD-10-CM | POA: Diagnosis not present

## 2018-02-28 DIAGNOSIS — M79671 Pain in right foot: Secondary | ICD-10-CM | POA: Diagnosis not present

## 2018-02-28 DIAGNOSIS — M79672 Pain in left foot: Secondary | ICD-10-CM | POA: Diagnosis not present

## 2018-02-28 DIAGNOSIS — M722 Plantar fascial fibromatosis: Secondary | ICD-10-CM | POA: Diagnosis not present

## 2018-03-29 ENCOUNTER — Other Ambulatory Visit: Payer: Self-pay | Admitting: Family Medicine

## 2018-03-29 ENCOUNTER — Other Ambulatory Visit (HOSPITAL_COMMUNITY)
Admission: RE | Admit: 2018-03-29 | Discharge: 2018-03-29 | Disposition: A | Discharge status: Home / Self Care | Payer: BLUE CROSS/BLUE SHIELD | Source: Ambulatory Visit | Attending: Family Medicine | Admitting: Family Medicine

## 2018-03-29 DIAGNOSIS — B373 Candidiasis of vulva and vagina: Secondary | ICD-10-CM | POA: Diagnosis not present

## 2018-03-29 DIAGNOSIS — F330 Major depressive disorder, recurrent, mild: Secondary | ICD-10-CM | POA: Diagnosis not present

## 2018-03-29 DIAGNOSIS — I10 Essential (primary) hypertension: Secondary | ICD-10-CM | POA: Diagnosis not present

## 2018-03-29 DIAGNOSIS — N771 Vaginitis, vulvitis and vulvovaginitis in diseases classified elsewhere: Secondary | ICD-10-CM | POA: Diagnosis not present

## 2018-03-31 LAB — URINE CYTOLOGY ANCILLARY ONLY
CHLAMYDIA, DNA PROBE: NEGATIVE
Candida vaginitis: NEGATIVE
NEISSERIA GONORRHEA: NEGATIVE
Trichomonas: NEGATIVE

## 2018-04-10 ENCOUNTER — Other Ambulatory Visit: Payer: Self-pay

## 2018-04-10 ENCOUNTER — Encounter (HOSPITAL_COMMUNITY): Payer: Self-pay | Admitting: Emergency Medicine

## 2018-04-10 ENCOUNTER — Ambulatory Visit (HOSPITAL_COMMUNITY)
Admission: EM | Admit: 2018-04-10 | Discharge: 2018-04-10 | Disposition: A | Discharge status: Home / Self Care | Payer: BLUE CROSS/BLUE SHIELD | Attending: Emergency Medicine | Admitting: Emergency Medicine

## 2018-04-10 DIAGNOSIS — R197 Diarrhea, unspecified: Secondary | ICD-10-CM

## 2018-04-10 DIAGNOSIS — R112 Nausea with vomiting, unspecified: Secondary | ICD-10-CM | POA: Diagnosis not present

## 2018-04-10 MED ORDER — ONDANSETRON 4 MG PO TBDP
ORAL_TABLET | ORAL | Status: AC
  Filled 2018-04-10: qty 2

## 2018-04-10 MED ORDER — ONDANSETRON 8 MG PO TBDP: ORAL_TABLET | ORAL | 0 refills | Status: AC

## 2018-04-10 MED ORDER — ONDANSETRON 4 MG PO TBDP
8.0000 mg | ORAL_TABLET | Freq: Once | ORAL | Status: AC
  Administered 2018-04-10: 8 mg via ORAL

## 2018-04-10 MED ORDER — ACETAMINOPHEN 500 MG PO TABS: 1000.0000 mg | ORAL_TABLET | Freq: Three times a day (TID) | ORAL | 0 refills | Status: AC | PRN

## 2018-04-10 NOTE — Discharge Instructions (Signed)
You may take the Zofran 3 times a day.  This will help with your diarrhea.  Push plenty of electrolyte containing fluids such as Pedialyte, Gatorade.  1 g of Tylenol 3 times a day as needed for pain.  Go immediately to the ER for abdominal pain not controlled with Tylenol, if you continue to have diarrhea and vomiting despite Zofran in 24 hours, if your abdominal pain changes or gets worse or migrates to the right lower part of your abdomen, fevers above 100.4, abdominal distention, if you have not urinated in 12 hours, or for any other concerns.

## 2018-04-10 NOTE — ED Triage Notes (Signed)
PT reports diarrhea, cramping, vomiting that started yesterday shortly after eating.

## 2018-04-10 NOTE — ED Provider Notes (Signed)
HPI  SUBJECTIVE:  Dana Morse is a 48 y.o. female who presents with an "upset stomach" after eating some street food yesterday.  She states this started immediately after eating a rib.  She reports nausea, intermittent diffuse crampy abdominal pain that lasts several minutes before the vomiting and diarrhea, which gets better afterwards.  She reports 4-5 episodes of nonbilious, nonbloody vomiting and watery, nonbloody diarrhea starting earlier this morning.  She reports bilateral low back pain described as achy, sore, constant which is worse with bending forward, torso rotation.  No fevers, distention, decreased urine output, urinary urgency, frequency, cloudy, odorous urine, hematuria.  No contacts with similar symptoms.  No change in her medicines, no recent antibiotics.  Patient states that she does not drink alcohol.  No coughing, wheezing, chest pain, shortness of breath.  States that she tried Pepto without improvement in her symptoms.  Symptoms are worse before she has emesis or diarrhea.  She has a past medical history of gastric bypass and 2 C-sections.  Also history of hypertension.  No history of diabetes, gastroparesis.  LMP: 2 years ago.  PMD: Renaye Rakers, MD    Past Medical History:  Diagnosis Date  . Hypertension     History reviewed. No pertinent surgical history.  No family history on file.  Social History   Tobacco Use  . Smoking status: Current Every Day Smoker    Packs/day: 0.25    Types: Cigarettes  . Smokeless tobacco: Never Used  Substance Use Topics  . Alcohol use: Yes    Comment: rare  . Drug use: No     Current Facility-Administered Medications:  .  ondansetron (ZOFRAN-ODT) disintegrating tablet 8 mg, 8 mg, Oral, Once, Domenick Gong, MD  Current Outpatient Medications:  .  buPROPion (WELLBUTRIN XL) 150 MG 24 hr tablet, Take 150 mg by mouth daily., Disp: , Rfl: 1 .  hydrochlorothiazide (HYDRODIURIL) 25 MG tablet, Take 25 mg by mouth daily., Disp: ,  Rfl: 3 .  acetaminophen (TYLENOL) 500 MG tablet, Take 2 tablets (1,000 mg total) by mouth every 8 (eight) hours as needed., Disp: 30 tablet, Rfl: 0 .  capsicum (ZOSTRIX) 0.075 % topical cream, Apply 1 application topically as needed (foot pain)., Disp: , Rfl:  .  diclofenac sodium (VOLTAREN) 1 % GEL, Apply 2 g topically 2 (two) times daily as needed (foot and knee pain)., Disp: , Rfl:  .  dicyclomine (BENTYL) 20 MG tablet, Take 1 tablet (20 mg total) by mouth 2 (two) times daily., Disp: 20 tablet, Rfl: 0 .  ondansetron (ZOFRAN ODT) 8 MG disintegrating tablet, 1/2- 1 tablet q 8 hr prn nausea, vomiting, Disp: 20 tablet, Rfl: 0  No Known Allergies   ROS  As noted in HPI.   Physical Exam  BP 137/82 (BP Location: Left Arm)   Pulse 73   Temp 98.6 F (37 C) (Oral)   Resp 16   SpO2 100%   Constitutional: Well developed, well nourished, no acute distress.  Moving around comfortably. Eyes:  EOMI, conjunctiva normal bilaterally HENT: Normocephalic, atraumatic,mucus membranes moist Respiratory: Normal inspiratory effort, lungs clear bilaterally Cardiovascular: Normal rate, regular rhythm, no murmurs, rubs, gallops GI: Normal appearance, healed surgical scars, nondistended.  Mild tenderness in the right flank, right lower quadrant with deep palpation.  No guarding, rebound.  Negative Murphy.  Negative tap table test. Back: No CVAT.  Positive para thoracic tenderness.  No thoracic, lumbar spine tenderness. skin: No rash, skin intact Musculoskeletal: no deformities Neurologic: Alert & oriented x  3, no focal neuro deficits Psychiatric: Speech and behavior appropriate   ED Course   Medications  ondansetron (ZOFRAN-ODT) disintegrating tablet 8 mg (has no administration in time range)    No orders of the defined types were placed in this encounter.   No results found for this or any previous visit (from the past 24 hour(s)). No results found.  ED Clinical Impression  Nausea vomiting  and diarrhea   ED Assessment/Plan  Patient has normal vital signs, she has mild right flank and right lower quadrant tenderness, no peritoneal signs.  She does have midthoracic tenderness which I suspect is from all of the vomiting.  She has no CVA tenderness.  Presentation most consistent with a gastroenteritis at this point in time.  Will send home with Zofran, Tylenol, push electrolyte containing fluids.  Gave her strict ER return precautions including if not getting better in 24 hours, if she gets worse, fevers above 100.4, abdominal distention, migration of the pain to the right lower quadrant or for any other concerns.  Discussed MDM, treatment plan, and plan for follow-up with patient. Discussed sn/sx that should prompt return to the ED. patient agrees with plan.   Meds ordered this encounter  Medications  . ondansetron (ZOFRAN-ODT) disintegrating tablet 8 mg  . ondansetron (ZOFRAN ODT) 8 MG disintegrating tablet    Sig: 1/2- 1 tablet q 8 hr prn nausea, vomiting    Dispense:  20 tablet    Refill:  0  . acetaminophen (TYLENOL) 500 MG tablet    Sig: Take 2 tablets (1,000 mg total) by mouth every 8 (eight) hours as needed.    Dispense:  30 tablet    Refill:  0    *This clinic note was created using Scientist, clinical (histocompatibility and immunogenetics)Dragon dictation software. Therefore, there may be occasional mistakes despite careful proofreading.   ?   Domenick GongMortenson, Ranata Laughery, MD 04/10/18 [phone]

## 2018-04-18 DIAGNOSIS — D508 Other iron deficiency anemias: Secondary | ICD-10-CM | POA: Diagnosis not present

## 2018-04-18 DIAGNOSIS — N944 Primary dysmenorrhea: Secondary | ICD-10-CM | POA: Diagnosis not present

## 2018-04-18 DIAGNOSIS — I10 Essential (primary) hypertension: Secondary | ICD-10-CM | POA: Diagnosis not present

## 2018-04-18 DIAGNOSIS — F339 Major depressive disorder, recurrent, unspecified: Secondary | ICD-10-CM | POA: Diagnosis not present

## 2018-05-21 ENCOUNTER — Other Ambulatory Visit: Payer: Self-pay

## 2018-05-21 ENCOUNTER — Encounter (HOSPITAL_COMMUNITY): Payer: Self-pay

## 2018-05-21 ENCOUNTER — Ambulatory Visit (HOSPITAL_COMMUNITY)
Admission: EM | Admit: 2018-05-21 | Discharge: 2018-05-21 | Disposition: A | Discharge status: Home / Self Care | Payer: BLUE CROSS/BLUE SHIELD | Attending: Internal Medicine | Admitting: Internal Medicine

## 2018-05-21 DIAGNOSIS — M5489 Other dorsalgia: Secondary | ICD-10-CM | POA: Diagnosis not present

## 2018-05-21 DIAGNOSIS — W228XXA Striking against or struck by other objects, initial encounter: Secondary | ICD-10-CM

## 2018-05-21 DIAGNOSIS — S86001A Unspecified injury of right Achilles tendon, initial encounter: Secondary | ICD-10-CM | POA: Diagnosis not present

## 2018-05-21 NOTE — ED Provider Notes (Signed)
[facility]    CSN: [id number] Arrival date & time: 05/21/18  1712     History   Chief Complaint Chief Complaint  Patient presents with  . Wound Check    HPI Dana Morse is a 48 y.o. female with a history of hypertension was seen in urgent care today for right leg pain following injury with a cart while she was shopping in [place].  Patient complained about mid back pain as well.  Pain is severe, sharp.  Aggravated by movement.  Not relieved by over-the-counter medications.  Patient was able to ambulate from the car to the waiting area in the urgent care building.  No ankle swelling.  No numbness or tingling.  No fever or chills.     Past Medical History:  Diagnosis Date  . Hypertension     There are no active problems to display for this patient.   History reviewed. No pertinent surgical history.  OB History   No obstetric history on file.      Home Medications    Prior to Admission medications   Medication Sig Start Date End Date Taking? Authorizing Provider  acetaminophen (TYLENOL) 500 MG tablet Take 2 tablets (1,000 mg total) by mouth every 8 (eight) hours as needed. 04/10/18   Domenick Gong, MD  buPROPion (WELLBUTRIN XL) 150 MG 24 hr tablet Take 150 mg by mouth daily. 08/19/17   [provider]  capsicum (ZOSTRIX) 0.075 % topical cream Apply 1 application topically as needed (foot pain).    [provider]  diclofenac sodium (VOLTAREN) 1 % GEL Apply 2 g topically 2 (two) times daily as needed (foot and knee pain).    [provider]  dicyclomine (BENTYL) 20 MG tablet Take 1 tablet (20 mg total) by mouth 2 (two) times daily. 09/12/17   Renne Crigler, PA-C  hydrochlorothiazide (HYDRODIURIL) 25 MG tablet Take 25 mg by mouth daily. 08/19/17   [provider]  ondansetron (ZOFRAN ODT) 8 MG disintegrating tablet 1/2- 1 tablet q 8 hr prn nausea, vomiting 04/10/18   Domenick Gong, MD    Family History History reviewed.  No pertinent family history.  Social History Social History   Tobacco Use  . Smoking status: Current Every Day Smoker    Packs/day: 0.25    Types: Cigarettes  . Smokeless tobacco: Never Used  Substance Use Topics  . Alcohol use: Yes    Comment: rare  . Drug use: No     Allergies   Patient has no known allergies.   Review of Systems Review of Systems  Constitutional: Negative for activity change and appetite change.  HENT: Negative for congestion, postnasal drip, rhinorrhea and sore throat.   Respiratory: Negative for apnea, cough, chest tightness, shortness of breath and wheezing.   Cardiovascular: Negative for chest pain and palpitations.  Gastrointestinal: Negative for abdominal distention.  Musculoskeletal: Positive for arthralgias. Negative for back pain, gait problem, joint swelling and myalgias.  Skin: Positive for wound. Negative for rash.  Neurological: Negative for dizziness, weakness and light-headedness.     Physical Exam Triage Vital Signs ED Triage Vitals  Enc Vitals Group     BP 05/21/18 1817 137/77     Pulse Rate 05/21/18 1817 80     Resp 05/21/18 1817 18     Temp 05/21/18 1817 99.3 F (37.4 C)     Temp Source 05/21/18 1817 Oral     SpO2 05/21/18 1817 98 %     Weight 05/21/18 1817 250 lb (  113.4 kg)     Height --      Head Circumference --      Peak Flow --      Pain Score 05/21/18 1818 10     Pain Loc --      Pain Edu? --      Excl. in GC? --    No data found.  Updated Vital Signs BP 137/77 (BP Location: Left Arm)   Pulse 80   Temp 99.3 F (37.4 C) (Oral)   Resp 18   Wt 113.4 kg   SpO2 98%   Visual Acuity Right Eye Distance:   Left Eye Distance:   Bilateral Distance:    Right Eye Near:   Left Eye Near:    Bilateral Near:     Physical Exam Vitals signs and nursing note reviewed.  Constitutional:      General: She is in acute distress.     Appearance: She is not ill-appearing.  Cardiovascular:     Rate and Rhythm: Regular  rhythm.     Pulses: Normal pulses.     Heart sounds: Normal heart sounds.  Pulmonary:     Effort: Pulmonary effort is normal.     Breath sounds: Normal breath sounds.  Abdominal:     General: Bowel sounds are normal.     Palpations: Abdomen is soft.  Musculoskeletal: Normal range of motion.        General: Swelling, tenderness and signs of injury present. No deformity.     Left lower leg: No edema.     Comments: Abrasion over the right Achilles tendon with mild swelling.  No erythema.  No ankle swelling.  Full range of motion around the ankle.  Skin:    Capillary Refill: Capillary refill takes less than 2 seconds.     Findings: Bruising and lesion present.  Neurological:     General: No focal deficit present.     Mental Status: She is alert and oriented to person, place, and time.      UC Treatments / Results  Labs (all labs ordered are listed, but only abnormal results are displayed) Labs Reviewed - No data to display  EKG None  Radiology No results found.  Procedures Procedures (including critical care time)  Medications Ordered in UC Medications - No data to display  Initial Impression / Assessment and Plan / UC Course  I have reviewed the triage vital signs and the nursing notes.  Pertinent labs & imaging results that were available during my care of the patient were reviewed by me and considered in my medical decision making (see chart for details).     1.  Right Achilles tendon injury: No indication of ankle fracture Tylenol/Motrin Wound dressing changes Gentle range of motion exercises Return to urgent care if symptoms does not improve in a few days. Final Clinical Impressions(s) / UC Diagnoses   Final diagnoses:  Achilles tendon injury, right, initial encounter   Discharge Instructions   None    ED Prescriptions    None     Controlled Substance Prescriptions [place] Controlled Substance Registry consulted? No   Merrilee Jansky, MD 05/21/18  [phone]
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
## 2018-05-23 DIAGNOSIS — M62831 Muscle spasm of calf: Secondary | ICD-10-CM | POA: Diagnosis not present

## 2018-05-23 DIAGNOSIS — S91311A Laceration without foreign body, right foot, initial encounter: Secondary | ICD-10-CM | POA: Diagnosis not present

## 2018-05-25 DIAGNOSIS — M545 Low back pain: Secondary | ICD-10-CM | POA: Diagnosis not present

## 2018-05-25 DIAGNOSIS — M546 Pain in thoracic spine: Secondary | ICD-10-CM | POA: Diagnosis not present

## 2018-06-01 DIAGNOSIS — M546 Pain in thoracic spine: Secondary | ICD-10-CM | POA: Diagnosis not present

## 2018-06-01 DIAGNOSIS — M545 Low back pain: Secondary | ICD-10-CM | POA: Diagnosis not present

## 2018-06-06 DIAGNOSIS — S91311D Laceration without foreign body, right foot, subsequent encounter: Secondary | ICD-10-CM | POA: Diagnosis not present

## 2018-06-07 DIAGNOSIS — M546 Pain in thoracic spine: Secondary | ICD-10-CM | POA: Diagnosis not present

## 2018-06-07 DIAGNOSIS — M545 Low back pain: Secondary | ICD-10-CM | POA: Diagnosis not present

## 2018-06-08 DIAGNOSIS — M545 Low back pain: Secondary | ICD-10-CM | POA: Diagnosis not present

## 2018-06-08 DIAGNOSIS — M546 Pain in thoracic spine: Secondary | ICD-10-CM | POA: Diagnosis not present

## 2018-06-13 DIAGNOSIS — S91311D Laceration without foreign body, right foot, subsequent encounter: Secondary | ICD-10-CM | POA: Diagnosis not present

## 2018-06-13 DIAGNOSIS — M545 Low back pain: Secondary | ICD-10-CM | POA: Diagnosis not present

## 2018-06-13 DIAGNOSIS — M546 Pain in thoracic spine: Secondary | ICD-10-CM | POA: Diagnosis not present

## 2018-06-15 DIAGNOSIS — M545 Low back pain: Secondary | ICD-10-CM | POA: Diagnosis not present

## 2018-06-15 DIAGNOSIS — M546 Pain in thoracic spine: Secondary | ICD-10-CM | POA: Diagnosis not present

## 2018-06-20 DIAGNOSIS — M546 Pain in thoracic spine: Secondary | ICD-10-CM | POA: Diagnosis not present

## 2018-06-20 DIAGNOSIS — M545 Low back pain: Secondary | ICD-10-CM | POA: Diagnosis not present

## 2018-06-22 DIAGNOSIS — M545 Low back pain: Secondary | ICD-10-CM | POA: Diagnosis not present

## 2018-06-22 DIAGNOSIS — M546 Pain in thoracic spine: Secondary | ICD-10-CM | POA: Diagnosis not present

## 2018-06-30 DIAGNOSIS — I10 Essential (primary) hypertension: Secondary | ICD-10-CM | POA: Diagnosis not present

## 2018-06-30 DIAGNOSIS — M545 Low back pain: Secondary | ICD-10-CM | POA: Diagnosis not present

## 2018-06-30 DIAGNOSIS — F320 Major depressive disorder, single episode, mild: Secondary | ICD-10-CM | POA: Diagnosis not present

## 2018-07-03 ENCOUNTER — Other Ambulatory Visit: Payer: Self-pay

## 2018-07-03 ENCOUNTER — Emergency Department (HOSPITAL_COMMUNITY)
Admission: EM | Admit: 2018-07-03 | Discharge: 2018-07-03 | Disposition: A | Discharge status: Home / Self Care | Payer: BLUE CROSS/BLUE SHIELD | Attending: Emergency Medicine | Admitting: Emergency Medicine

## 2018-07-03 ENCOUNTER — Encounter (HOSPITAL_COMMUNITY): Payer: Self-pay | Admitting: Emergency Medicine

## 2018-07-03 DIAGNOSIS — Z79899 Other long term (current) drug therapy: Secondary | ICD-10-CM | POA: Diagnosis not present

## 2018-07-03 DIAGNOSIS — E860 Dehydration: Secondary | ICD-10-CM | POA: Diagnosis not present

## 2018-07-03 DIAGNOSIS — F17210 Nicotine dependence, cigarettes, uncomplicated: Secondary | ICD-10-CM | POA: Diagnosis not present

## 2018-07-03 DIAGNOSIS — R252 Cramp and spasm: Secondary | ICD-10-CM | POA: Diagnosis not present

## 2018-07-03 DIAGNOSIS — M62838 Other muscle spasm: Secondary | ICD-10-CM | POA: Diagnosis not present

## 2018-07-03 DIAGNOSIS — M62831 Muscle spasm of calf: Secondary | ICD-10-CM | POA: Diagnosis not present

## 2018-07-03 DIAGNOSIS — I10 Essential (primary) hypertension: Secondary | ICD-10-CM | POA: Diagnosis not present

## 2018-07-03 HISTORY — DX: Hypokalemia: E87.6

## 2018-07-03 LAB — I-STAT BETA HCG BLOOD, ED (MC, WL, AP ONLY): I-stat hCG, quantitative: <5 m[IU]/mL (ref <5)

## 2018-07-03 LAB — CBC WITH DIFFERENTIAL/PLATELET
Abs Immature Granulocytes: 0.01 10*3/uL (ref 0.00–0.07)
Basophils Absolute: 0 10*3/uL (ref 0.0–0.1)
Basophils Relative: 1 %
Eosinophils Absolute: 0.1 10*3/uL (ref 0.0–0.5)
Eosinophils Relative: 1 %
HCT: 38.4 % (ref 36.0–46.0)
Hemoglobin: 11.2 g/dL — ABNORMAL LOW (ref 12.0–15.0)
Immature Granulocytes: 0 %
Lymphocytes Relative: 46 %
Lymphs Abs: 3 10*3/uL (ref 0.7–4.0)
MCH: 23.5 pg — ABNORMAL LOW (ref 26.0–34.0)
MCHC: 29.2 g/dL — ABNORMAL LOW (ref 30.0–36.0)
MCV: 80.7 fL (ref 80.0–100.0)
Monocytes Absolute: 0.5 10*3/uL (ref 0.1–1.0)
Monocytes Relative: 8 %
Neutro Abs: 2.9 10*3/uL (ref 1.7–7.7)
Neutrophils Relative %: 44 %
Platelets: 325 10*3/uL (ref 150–400)
RBC: 4.76 MIL/uL (ref 3.87–5.11)
RDW: 15.6 % — ABNORMAL HIGH (ref 11.5–15.5)
WBC: 6.5 10*3/uL (ref 4.0–10.5)
nRBC: 0 % (ref 0.0–0.2)

## 2018-07-03 LAB — BASIC METABOLIC PANEL
Anion gap: 12 (ref 5–15)
BUN: 15 mg/dL (ref 6–20)
CO2: 22 mmol/L (ref 22–32)
Calcium: 9.6 mg/dL (ref 8.9–10.3)
Chloride: 104 mmol/L (ref 98–111)
Creatinine, Ser: 1.12 mg/dL — ABNORMAL HIGH (ref 0.44–1.00)
GFR calc Af Amer: >60 mL/min (ref >60)
GFR calc non Af Amer: 58 mL/min — ABNORMAL LOW (ref >60)
Glucose, Bld: 86 mg/dL (ref 70–99)
Potassium: 3.8 mmol/L (ref 3.5–5.1)
Sodium: 138 mmol/L (ref 135–145)

## 2018-07-03 LAB — MAGNESIUM: Magnesium: 1.9 mg/dL (ref 1.7–2.4)

## 2018-07-03 MED ORDER — SODIUM CHLORIDE 0.9 % IV BOLUS
1000.0000 mL | Freq: Once | INTRAVENOUS | Status: AC
  Administered 2018-07-03: 1000 mL via INTRAVENOUS

## 2018-07-03 MED ORDER — ACETAMINOPHEN 325 MG PO TABS
650.0000 mg | ORAL_TABLET | Freq: Once | ORAL | Status: AC
  Administered 2018-07-03: 650 mg via ORAL
  Filled 2018-07-03: qty 2

## 2018-07-03 NOTE — Discharge Instructions (Signed)
°  Dehydration  We suspect you may be dehydrated.  Symptoms of any other illness will be intensified and complicated by dehydration. Dehydration can also extend the duration of symptoms. Dehydration typically causes its own symptoms including lightheadedness, nausea, headaches, fatigue increased thirst, and generally feeling unwell. Drink plenty of fluids and get plenty of rest. You should be drinking at least half a liter of water every hour or two to stay hydrated. Electrolyte drinks (ex. Gatorade, Powerade, Pedialyte) are also encouraged. You should be drinking enough fluids to make your urine light yellow, almost clear. If this is not the case, you are not drinking enough water.  Follow-up with your primary care provider.  Call the office today.  You will likely need rechecks of your kidney function and have your hydrochlorothiazide reassessed.

## 2018-07-03 NOTE — ED Notes (Signed)
ED Provider at bedside. 

## 2018-07-03 NOTE — ED Notes (Signed)
Pt given [place] sandwich and sprite; ok per [place], [place].

## 2018-07-03 NOTE — ED Triage Notes (Signed)
Pt arrives POV from home c/o muscles spasms in hands, feet, legs, and hips. Pt stated spasms started Friday evening when cooking dinner; pt stated it first started in her hands and later progressed to her legs, hips and feet while in bed. Pt reports it lasted 4-5 hours that night.

## 2018-07-03 NOTE — ED Notes (Signed)
Patient verbalizes understanding of discharge instructions. Opportunity for questioning and answers were provided. Armband removed by staff, pt discharged from ED.  

## 2018-07-03 NOTE — ED Provider Notes (Signed)
[facility] EMERGENCY DEPARTMENT Provider Note   CSN: [id number] Arrival date & time: 07/03/18  4098    History   Chief Complaint Chief Complaint  Patient presents with  . Spasms    in hands,legs, and feet    HPI Dana Morse is a 48 y.o. female.     HPI   Dana Morse is a 48 y.o. female, with a history of HTN and hypokalemia, presenting to the ED with muscle spasms beginning 2 days ago.  Patient states she was fixing dinner of April 24 when her hands began to spasm and contract.  Since then, she has experienced muscle spasms in her hands, hips, calves, and feet. She has muscle spasms in her back, but states this is from a different issue, has been present for the past 2 weeks, and is being treated with Robaxin.  She notes she is supposed to be taking a potassium supplement daily, but has not been compliant with this.  She is prescribed HCTZ, but has not taken her dose this morning.  She states she may not be staying as hydrated as she should. No recent injuries, wounds, or insect/spider bites.    She previously has experienced chronic constipation, but over the last couple days she has had 1 bowel movement daily and states they were "comfortable consistency."  Denies fever/chills, N/V/D, urinary symptoms, abdominal pain, chest pain, shortness of breath, syncope, dizziness, or any other complaints.     Past Medical History:  Diagnosis Date  . Hypertension   . Hypokalemia     There are no active problems to display for this patient.   Past Surgical History:  Procedure Laterality Date  . GASTRIC BYPASS       OB History   No obstetric history on file.      Home Medications    Prior to Admission medications   Medication Sig Start Date End Date Taking? Authorizing Provider  buPROPion (WELLBUTRIN XL) 150 MG 24 hr tablet Take 150 mg by mouth daily. 08/19/17  Yes [provider]  hydrochlorothiazide (HYDRODIURIL) 25 MG tablet Take 25 mg  by mouth daily. 08/19/17  Yes [provider]  METHOCARBAMOL PO Take 1 tablet by mouth every 8 (eight) hours as needed (muscle spasms).   Yes [provider]  Vitamin D, Ergocalciferol, (DRISDOL) 1.25 MG (50000 UT) CAPS capsule Take 50,000 Units by mouth once a week. 05/11/18  Yes [provider]  acetaminophen (TYLENOL) 500 MG tablet Take 2 tablets (1,000 mg total) by mouth every 8 (eight) hours as needed. Patient not taking: Reported on 07/03/2018 04/10/18   Domenick Gong, MD  dicyclomine (BENTYL) 20 MG tablet Take 1 tablet (20 mg total) by mouth 2 (two) times daily. Patient not taking: Reported on 07/03/2018 09/12/17   Renne Crigler, PA-C  ondansetron (ZOFRAN ODT) 8 MG disintegrating tablet 1/2- 1 tablet q 8 hr prn nausea, vomiting Patient not taking: Reported on 07/03/2018 04/10/18   Domenick Gong, MD    Family History History reviewed. No pertinent family history.  Social History Social History   Tobacco Use  . Smoking status: Current Every Day Smoker    Packs/day: 0.25    Types: Cigarettes  . Smokeless tobacco: Never Used  Substance Use Topics  . Alcohol use: Yes    Comment: rare  . Drug use: No     Allergies   Lisinopril   Review of Systems Review of Systems  Constitutional: Negative for chills and fever.  Respiratory: Negative for  cough and shortness of breath.   Cardiovascular: Negative for chest pain and leg swelling.  Gastrointestinal: Negative for abdominal pain, diarrhea, nausea and vomiting.  Musculoskeletal: Negative for neck stiffness.       Muscle spasms  Neurological: Negative for dizziness, syncope, weakness, light-headedness, numbness and headaches.  All other systems reviewed and are negative.    Physical Exam Updated Vital Signs BP (!) 146/100   Pulse 99   Temp 98.9 F (37.2 C) (Oral)   Resp 15   Ht 5\' 1"  (1.549 m)   Wt 90.3 kg   SpO2 100%   BMI 37.60 kg/m   Physical Exam Vitals signs and nursing note reviewed.   Constitutional:      General: She is not in acute distress.    Appearance: She is well-developed. She is not diaphoretic.  HENT:     Head: Normocephalic and atraumatic.     Mouth/Throat:     Mouth: Mucous membranes are moist.     Pharynx: Oropharynx is clear.  Eyes:     Extraocular Movements: Extraocular movements intact.     Conjunctiva/sclera: Conjunctivae normal.     Pupils: Pupils are equal, round, and reactive to light.  Neck:     Musculoskeletal: Neck supple.  Cardiovascular:     Rate and Rhythm: Normal rate and regular rhythm.     Pulses: Normal pulses.          Radial pulses are 2+ on the right side and 2+ on the left side.       Dorsalis pedis pulses are 2+ on the right side and 2+ on the left side.       Posterior tibial pulses are 2+ on the right side and 2+ on the left side.     Heart sounds: Normal heart sounds.     Comments: Tactile temperature in the extremities appropriate and equal bilaterally. Pulmonary:     Effort: Pulmonary effort is normal. No respiratory distress.     Breath sounds: Normal breath sounds.  Abdominal:     Palpations: Abdomen is soft.     Tenderness: There is no abdominal tenderness. There is no guarding.  Musculoskeletal:     Right lower leg: No edema.     Left lower leg: No edema.  Lymphadenopathy:     Cervical: No cervical adenopathy.  Skin:    General: Skin is warm and dry.  Neurological:     Mental Status: She is alert and oriented to person, place, and time.     Deep Tendon Reflexes:     Reflex Scores:      Bicep reflexes are 2+ on the right side and 2+ on the left side.      Patellar reflexes are 2+ on the right side and 2+ on the left side.    Comments: Sensation grossly intact to light touch in the extremities. No noted speech deficits. No aphasia. Patient handles oral secretions without difficulty. No noted swallowing defects.  Equal grip strength bilaterally. Strength 5/5 in the upper extremities. Strength 5/5 in the lower  extremities. Patellar DTRs 2+ bilaterally Ambulatory without assistance. Coordination intact. Cranial nerves III-XII grossly intact.  No facial droop.   Psychiatric:        Mood and Affect: Mood and affect normal.        Speech: Speech normal.        Behavior: Behavior normal.      ED Treatments / Results  Labs (all labs ordered are listed, but only abnormal  results are displayed) Labs Reviewed  BASIC METABOLIC PANEL - Abnormal; Notable for the following components:      Result Value   Creatinine, Ser 1.12 (*)    GFR calc non Af Amer 58 (*)    All other components within normal limits  CBC WITH DIFFERENTIAL/PLATELET - Abnormal; Notable for the following components:   Hemoglobin 11.2 (*)    MCH 23.5 (*)    MCHC 29.2 (*)    RDW 15.6 (*)    All other components within normal limits  MAGNESIUM  I-STAT BETA HCG BLOOD, ED ([facility], [facility], [facility] ONLY)  I-STAT BETA HCG BLOOD, ED ([facility], [facility], [facility] ONLY)   Hemoglobin  Date Value Ref Range Status  07/03/2018 11.2 (L) 12.0 - 15.0 g/dL Final  30/13/1438 9.9 (L) 12.0 - 15.0 g/dL Final   BUN  Date Value Ref Range Status  07/03/2018 15 6 - 20 mg/dL Final  88/75/7972 10 6 - 20 mg/dL Final    Comment:    Please note change in reference range.   Creatinine, Ser  Date Value Ref Range Status  07/03/2018 1.12 (H) 0.44 - 1.00 mg/dL Final  82/08/154 1.53 0.44 - 1.00 mg/dL Final     EKG EKG Interpretation  Date/Time:  Sunday July 03 2018 09:27:34 EDT Ventricular Rate:  92 PR Interval:    QRS Duration: 83 QT Interval:  332 QTC Calculation: 411 R Axis:   10 Text Interpretation:  Sinus rhythm Confirmed by Kennis Carina [phone]) on 07/03/2018 10:57:36 AM   Radiology No results found.  Procedures Procedures (including critical care time)  Medications Ordered in ED Medications  sodium chloride 0.9 % bolus 1,000 mL (1,000 mLs Intravenous New Bag/Given 07/03/18 0950)  acetaminophen (TYLENOL) tablet 650 mg (650 mg Oral Given 07/03/18 1049)      Initial Impression / Assessment and Plan / ED Course  I have reviewed the triage vital signs and the nursing notes.  Pertinent labs & imaging results that were available during my care of the patient were reviewed by me and considered in my medical decision making (see chart for details).        Patient presents with intermittent muscle spasms. Patient is nontoxic appearing, afebrile, not tachycardic, not tachypneic, not hypotensive, excellent SPO2 on room air, and is in no apparent distress.  No acute EKG changes.  Small increase in patient's creatinine from 0.87 to 1.12.  Potassium and magnesium within normal lab limits.  Dehydration may be a factor.  Patient is also on HCTZ.  She has been instructed to have close follow-up with her PCP on this matter. I also sent the patient's PCP a message through epic to bring the patient's visit to her attention. The patient was given instructions for home care as well as return precautions. Patient voices understanding of these instructions, accepts the plan, and is comfortable with discharge.  Findings and plan of care discussed with Kennis Carina, MD.   Final Clinical Impressions(s) / ED Diagnoses   Final diagnoses:  Muscle spasm    ED Discharge Orders    None       Concepcion Living 07/03/18 1127    Sabas Sous, MD 07/04/18 1128

## 2018-07-04 DIAGNOSIS — M545 Low back pain: Secondary | ICD-10-CM | POA: Diagnosis not present

## 2018-07-04 DIAGNOSIS — M546 Pain in thoracic spine: Secondary | ICD-10-CM | POA: Diagnosis not present

## 2018-07-06 DIAGNOSIS — M25512 Pain in left shoulder: Secondary | ICD-10-CM | POA: Diagnosis not present

## 2018-07-06 DIAGNOSIS — M545 Low back pain: Secondary | ICD-10-CM | POA: Diagnosis not present

## 2018-07-06 DIAGNOSIS — M546 Pain in thoracic spine: Secondary | ICD-10-CM | POA: Diagnosis not present

## 2018-07-11 DIAGNOSIS — M545 Low back pain: Secondary | ICD-10-CM | POA: Diagnosis not present

## 2018-07-11 DIAGNOSIS — M25512 Pain in left shoulder: Secondary | ICD-10-CM | POA: Diagnosis not present

## 2018-07-11 DIAGNOSIS — M546 Pain in thoracic spine: Secondary | ICD-10-CM | POA: Diagnosis not present

## 2018-07-13 DIAGNOSIS — M25512 Pain in left shoulder: Secondary | ICD-10-CM | POA: Diagnosis not present

## 2018-07-13 DIAGNOSIS — M545 Low back pain: Secondary | ICD-10-CM | POA: Diagnosis not present

## 2018-07-13 DIAGNOSIS — M546 Pain in thoracic spine: Secondary | ICD-10-CM | POA: Diagnosis not present

## 2018-07-19 DIAGNOSIS — M25512 Pain in left shoulder: Secondary | ICD-10-CM | POA: Diagnosis not present

## 2018-07-19 DIAGNOSIS — M546 Pain in thoracic spine: Secondary | ICD-10-CM | POA: Diagnosis not present

## 2018-07-19 DIAGNOSIS — M545 Low back pain: Secondary | ICD-10-CM | POA: Diagnosis not present

## 2018-07-28 DIAGNOSIS — M25512 Pain in left shoulder: Secondary | ICD-10-CM | POA: Diagnosis not present

## 2018-07-28 DIAGNOSIS — M546 Pain in thoracic spine: Secondary | ICD-10-CM | POA: Diagnosis not present

## 2018-07-28 DIAGNOSIS — M545 Low back pain: Secondary | ICD-10-CM | POA: Diagnosis not present

## 2018-08-03 DIAGNOSIS — M25512 Pain in left shoulder: Secondary | ICD-10-CM | POA: Diagnosis not present

## 2018-08-03 DIAGNOSIS — M545 Low back pain: Secondary | ICD-10-CM | POA: Diagnosis not present

## 2018-08-03 DIAGNOSIS — M546 Pain in thoracic spine: Secondary | ICD-10-CM | POA: Diagnosis not present

## 2018-08-10 DIAGNOSIS — M7582 Other shoulder lesions, left shoulder: Secondary | ICD-10-CM | POA: Diagnosis not present

## 2018-08-10 DIAGNOSIS — S233XXA Sprain of ligaments of thoracic spine, initial encounter: Secondary | ICD-10-CM | POA: Diagnosis not present

## 2018-08-10 DIAGNOSIS — M5412 Radiculopathy, cervical region: Secondary | ICD-10-CM | POA: Diagnosis not present

## 2018-08-10 DIAGNOSIS — M542 Cervicalgia: Secondary | ICD-10-CM | POA: Diagnosis not present

## 2018-08-10 DIAGNOSIS — S139XXA Sprain of joints and ligaments of unspecified parts of neck, initial encounter: Secondary | ICD-10-CM | POA: Diagnosis not present

## 2018-08-11 DIAGNOSIS — M546 Pain in thoracic spine: Secondary | ICD-10-CM | POA: Diagnosis not present

## 2018-08-11 DIAGNOSIS — M545 Low back pain: Secondary | ICD-10-CM | POA: Diagnosis not present

## 2018-08-11 DIAGNOSIS — M25512 Pain in left shoulder: Secondary | ICD-10-CM | POA: Diagnosis not present

## 2018-08-13 DIAGNOSIS — M546 Pain in thoracic spine: Secondary | ICD-10-CM | POA: Diagnosis not present

## 2018-08-13 DIAGNOSIS — M542 Cervicalgia: Secondary | ICD-10-CM | POA: Diagnosis not present

## 2018-09-19 ENCOUNTER — Other Ambulatory Visit: Payer: Self-pay

## 2018-09-19 ENCOUNTER — Emergency Department (HOSPITAL_COMMUNITY)
Admission: EM | Admit: 2018-09-19 | Discharge: 2018-09-20 | Disposition: A | Discharge status: Home / Self Care | Payer: BC Managed Care – PPO | Attending: Emergency Medicine | Admitting: Emergency Medicine

## 2018-09-19 DIAGNOSIS — R000 Tachycardia, unspecified: Secondary | ICD-10-CM | POA: Diagnosis not present

## 2018-09-19 DIAGNOSIS — R0689 Other abnormalities of breathing: Secondary | ICD-10-CM | POA: Diagnosis not present

## 2018-09-19 DIAGNOSIS — F17210 Nicotine dependence, cigarettes, uncomplicated: Secondary | ICD-10-CM | POA: Insufficient documentation

## 2018-09-19 DIAGNOSIS — T404X1A Poisoning by other synthetic narcotics, accidental (unintentional), initial encounter: Secondary | ICD-10-CM | POA: Diagnosis not present

## 2018-09-19 DIAGNOSIS — T50901A Poisoning by unspecified drugs, medicaments and biological substances, accidental (unintentional), initial encounter: Secondary | ICD-10-CM

## 2018-09-19 DIAGNOSIS — T43011A Poisoning by tricyclic antidepressants, accidental (unintentional), initial encounter: Secondary | ICD-10-CM | POA: Diagnosis not present

## 2018-09-19 DIAGNOSIS — Z03818 Encounter for observation for suspected exposure to other biological agents ruled out: Secondary | ICD-10-CM | POA: Insufficient documentation

## 2018-09-19 DIAGNOSIS — T43291A Poisoning by other antidepressants, accidental (unintentional), initial encounter: Secondary | ICD-10-CM | POA: Diagnosis not present

## 2018-09-19 DIAGNOSIS — R4020 Unspecified coma: Secondary | ICD-10-CM | POA: Diagnosis not present

## 2018-09-19 DIAGNOSIS — I10 Essential (primary) hypertension: Secondary | ICD-10-CM | POA: Insufficient documentation

## 2018-09-19 DIAGNOSIS — R0902 Hypoxemia: Secondary | ICD-10-CM | POA: Diagnosis not present

## 2018-09-19 DIAGNOSIS — T50904A Poisoning by unspecified drugs, medicaments and biological substances, undetermined, initial encounter: Secondary | ICD-10-CM | POA: Diagnosis not present

## 2018-09-19 LAB — CBC
HCT: 35.6 % — ABNORMAL LOW (ref 36.0–46.0)
Hemoglobin: 10.5 g/dL — ABNORMAL LOW (ref 12.0–15.0)
MCH: 23.6 pg — ABNORMAL LOW (ref 26.0–34.0)
MCHC: 29.5 g/dL — ABNORMAL LOW (ref 30.0–36.0)
MCV: 80.2 fL (ref 80.0–100.0)
Platelets: 351 10*3/uL (ref 150–400)
RBC: 4.44 MIL/uL (ref 3.87–5.11)
RDW: 16 % — ABNORMAL HIGH (ref 11.5–15.5)
WBC: 12.5 10*3/uL — ABNORMAL HIGH (ref 4.0–10.5)
nRBC: 0 % (ref 0.0–0.2)

## 2018-09-19 LAB — RAPID URINE DRUG SCREEN, HOSP PERFORMED
Amphetamines: NOT DETECTED
Barbiturates: NOT DETECTED
Benzodiazepines: NOT DETECTED
Cocaine: NOT DETECTED
Opiates: POSITIVE — AB
Tetrahydrocannabinol: POSITIVE — AB

## 2018-09-19 LAB — SARS CORONAVIRUS 2 BY RT PCR (HOSPITAL ORDER, PERFORMED IN ~~LOC~~ HOSPITAL LAB): SARS Coronavirus 2: NEGATIVE

## 2018-09-19 MED ORDER — SODIUM CHLORIDE 0.9 % IV BOLUS
1000.0000 mL | Freq: Once | INTRAVENOUS | Status: AC
  Administered 2018-09-19: 23:00:00 1000 mL via INTRAVENOUS

## 2018-09-19 NOTE — ED Provider Notes (Signed)
[facility] EMERGENCY DEPARTMENT Provider Note   CSN: [id number] Arrival date & time: 09/19/18  2110     History   Chief Complaint No chief complaint on file.   HPI Dana Morse is a 48 y.o. female.     Patient presents via EMS s/p accidental overdose. Pt reports missing her wellbutrin dose yesterday, so took 2 tonight at same time as taking 3 ultram for her low back pain. Pt became very drowsy, ems was called - ems reports bag assisting ventilation and given narcan with abrupt increase in awareness. Pt denies trying to harm self or intentionally overdose. Denies other etoh or drug use. Denies headache or head injury. No chest pain or discomfort. No sob. No abd pain or nvd. Normal appetite. Denies depression or thoughts of self harm. States low back pain is not new or different. No radicular pain or leg numbness/weakness. Pt does request covid test noting recent contact covid positive.   The history is provided by the patient.    Past Medical History:  Diagnosis Date  . Hypertension   . Hypokalemia     There are no active problems to display for this patient.   Past Surgical History:  Procedure Laterality Date  . GASTRIC BYPASS       OB History   No obstetric history on file.      Home Medications    Prior to Admission medications   Medication Sig Start Date End Date Taking? Authorizing Provider  acetaminophen (TYLENOL) 500 MG tablet Take 2 tablets (1,000 mg total) by mouth every 8 (eight) hours as needed. Patient not taking: Reported on 07/03/2018 04/10/18   Melynda Ripple, MD  buPROPion (WELLBUTRIN XL) 150 MG 24 hr tablet Take 150 mg by mouth daily. 08/19/17   [provider]  dicyclomine (BENTYL) 20 MG tablet Take 1 tablet (20 mg total) by mouth 2 (two) times daily. Patient not taking: Reported on 07/03/2018 09/12/17   Carlisle Cater, PA-C  hydrochlorothiazide (HYDRODIURIL) 25 MG tablet Take 25 mg by mouth daily. 08/19/17   [provider]  METHOCARBAMOL PO Take 1 tablet by mouth every 8 (eight) hours as needed (muscle spasms).    [provider]  ondansetron (ZOFRAN ODT) 8 MG disintegrating tablet 1/2- 1 tablet q 8 hr prn nausea, vomiting Patient not taking: Reported on 07/03/2018 04/10/18   Melynda Ripple, MD  Vitamin D, Ergocalciferol, (DRISDOL) 1.25 MG (50000 UT) CAPS capsule Take 50,000 Units by mouth once a week. 05/11/18   [provider]    Family History No family history on file.  Social History Social History   Tobacco Use  . Smoking status: Current Every Day Smoker    Packs/day: 0.25    Types: Cigarettes  . Smokeless tobacco: Never Used  Substance Use Topics  . Alcohol use: Yes    Comment: rare  . Drug use: No     Allergies   Lisinopril   Review of Systems Review of Systems  Constitutional: Negative for fever.  HENT: Negative for sore throat.   Eyes: Negative for redness.  Respiratory: Negative for cough and shortness of breath.   Cardiovascular: Negative for chest pain.  Gastrointestinal: Negative for abdominal pain and vomiting.  Genitourinary: Negative for flank pain.  Musculoskeletal: Positive for back pain. Negative for neck pain.  Skin: Negative for rash.  Neurological: Negative for weakness, numbness and headaches.  Hematological: Does not bruise/bleed easily.  Psychiatric/Behavioral: Negative for confusion.     Physical Exam Updated  Vital Signs BP (!) 156/103 (BP Location: Right Arm)   Pulse (!) 118   Temp 98.5 F (36.9 C) (Oral)   Resp 16   Ht 1.549 m (5\' 1" )   Wt 89.8 kg   SpO2 98%   BMI 37.41 kg/m   Physical Exam Vitals signs and nursing note reviewed.  Constitutional:      Appearance: Normal appearance. She is well-developed.  HENT:     Head: Atraumatic.     Nose: Nose normal.     Mouth/Throat:     Mouth: Mucous membranes are moist.  Eyes:     General: No scleral icterus.    Conjunctiva/sclera: Conjunctivae normal.     Pupils:  Pupils are equal, round, and reactive to light.  Neck:     Musculoskeletal: Normal range of motion and neck supple. No neck rigidity or muscular tenderness.     Trachea: No tracheal deviation.  Cardiovascular:     Rate and Rhythm: Regular rhythm. Tachycardia present.     Pulses: Normal pulses.     Heart sounds: Normal heart sounds. No murmur. No friction rub. No gallop.   Pulmonary:     Effort: Pulmonary effort is normal. No respiratory distress.     Breath sounds: Normal breath sounds.  Abdominal:     General: Bowel sounds are normal. There is no distension.     Palpations: Abdomen is soft.     Tenderness: There is no abdominal tenderness. There is no guarding.  Genitourinary:    Comments: No cva tenderness.  Musculoskeletal:        General: No swelling.     Comments: CTLS spine, non tender, aligned, no step off.   Skin:    General: Skin is warm and dry.     Findings: No rash.  Neurological:     Mental Status: She is alert.     Comments: Alert, speech normal. Motor intact bil, stre 5/5. sens grossly intact bil.   Psychiatric:        Mood and Affect: Mood normal.      ED Treatments / Results  Labs (all labs ordered are listed, but only abnormal results are displayed) Results for orders placed or performed during the hospital encounter of 09/19/18  Rapid urine drug screen (hospital performed)  Result Value Ref Range   Opiates POSITIVE (A) NONE DETECTED   Cocaine NONE DETECTED NONE DETECTED   Benzodiazepines NONE DETECTED NONE DETECTED   Amphetamines NONE DETECTED NONE DETECTED   Tetrahydrocannabinol POSITIVE (A) NONE DETECTED   Barbiturates NONE DETECTED NONE DETECTED   EKG None  Radiology No results found.  Procedures Procedures (including critical care time)  Medications Ordered in ED Medications - No data to display   Initial Impression / Assessment and Plan / ED Course  I have reviewed the triage vital signs and the nursing notes.  Pertinent labs &  imaging results that were available during my care of the patient were reviewed by me and considered in my medical decision making (see chart for details).  Iv ns. Continuous pulse ox and monitor. o2 [place].   Labs sent.   Reviewed nursing notes and prior charts for additional history.   Labs reviewed by me - UDS pos thc, opiates. Additional labs remain pending.   2320, signed out to Dr Eudelia Bunchardama to check labs when resulted, recheck pt, and dispo appropriately.     Final Clinical Impressions(s) / ED Diagnoses   Final diagnoses:  None    ED Discharge Orders  None       Cathren LaineSteinl, Andriy Sherk, MD 09/19/18 2320

## 2018-09-19 NOTE — ED Triage Notes (Signed)
EMS called for unresponsive pt. Pt took 3 tramadol and 2 Wellbutrin. She had forgotten to take her medicine yesterday and took them all today. EMS reports they had to bag patient to get o2 sats up. Narcan was administered and pt woke up. Placed on nonrebreather and sats came up to 96%. Pt is awake, alert and oriented.

## 2018-09-20 LAB — COMPREHENSIVE METABOLIC PANEL
ALT: 33 U/L (ref 0–44)
AST: 76 U/L — ABNORMAL HIGH (ref 15–41)
Albumin: 3.7 g/dL (ref 3.5–5.0)
Alkaline Phosphatase: 83 U/L (ref 38–126)
Anion gap: 11 (ref 5–15)
BUN: 12 mg/dL (ref 6–20)
CO2: 22 mmol/L (ref 22–32)
Calcium: 9 mg/dL (ref 8.9–10.3)
Chloride: 107 mmol/L (ref 98–111)
Creatinine, Ser: 1.26 mg/dL — ABNORMAL HIGH (ref 0.44–1.00)
GFR calc Af Amer: 58 mL/min — ABNORMAL LOW (ref >60)
GFR calc non Af Amer: 50 mL/min — ABNORMAL LOW (ref >60)
Glucose, Bld: 108 mg/dL — ABNORMAL HIGH (ref 70–99)
Potassium: 3.6 mmol/L (ref 3.5–5.1)
Sodium: 140 mmol/L (ref 135–145)
Total Bilirubin: 0.4 mg/dL (ref 0.3–1.2)
Total Protein: 7.7 g/dL (ref 6.5–8.1)

## 2018-09-20 LAB — ACETAMINOPHEN LEVEL: Acetaminophen (Tylenol), Serum: <10 ug/mL — ABNORMAL LOW (ref 10–30)

## 2018-09-20 LAB — ETHANOL: Alcohol, Ethyl (B): <10 mg/dL (ref <10)

## 2018-09-20 LAB — SALICYLATE LEVEL: Salicylate Lvl: <7 mg/dL (ref 2.8–30.0)

## 2018-09-20 NOTE — ED Notes (Signed)
Discharge instructions discussed with pt. Pt verbalized understanding. Pt stable and ambulatory. No signature pad available. 

## 2018-09-20 NOTE — ED Provider Notes (Signed)
Labs reassuring.  Remained hemodynamically stable.  Able to ambulate without complication.  The patient is safe for discharge with strict return precautions.   The patient appears reasonably screened and/or stabilized for discharge and I doubt any other medical condition or other [facility] requiring further screening, evaluation, or treatment in the ED at this time prior to discharge.  Disposition: Discharge  Condition: Good  I have discussed the results, Dx and Tx plan with the patient who expressed understanding and agree(s) with the plan. Discharge instructions discussed at great length. The patient was given strict return precautions who verbalized understanding of the instructions. No further questions at time of discharge.    ED Discharge Orders    None        Follow Up: Lucianne Lei, [address] [place] [address] [phone]         Fatima Blank, MD 09/20/18 [phone]

## 2018-09-20 NOTE — ED Notes (Signed)
Pt ambulating with NT well. No dizziness, no SOB, maintaining O2 sats well.

## 2018-09-27 DIAGNOSIS — E669 Obesity, unspecified: Secondary | ICD-10-CM | POA: Diagnosis not present

## 2018-09-27 DIAGNOSIS — I10 Essential (primary) hypertension: Secondary | ICD-10-CM | POA: Diagnosis not present

## 2018-10-27 DIAGNOSIS — I10 Essential (primary) hypertension: Secondary | ICD-10-CM | POA: Diagnosis not present

## 2018-10-27 DIAGNOSIS — S91311D Laceration without foreign body, right foot, subsequent encounter: Secondary | ICD-10-CM | POA: Diagnosis not present

## 2018-10-29 DIAGNOSIS — R109 Unspecified abdominal pain: Secondary | ICD-10-CM | POA: Diagnosis not present

## 2019-01-11 DIAGNOSIS — M62831 Muscle spasm of calf: Secondary | ICD-10-CM | POA: Diagnosis not present

## 2019-01-11 DIAGNOSIS — I10 Essential (primary) hypertension: Secondary | ICD-10-CM | POA: Diagnosis not present

## 2019-01-11 DIAGNOSIS — E559 Vitamin D deficiency, unspecified: Secondary | ICD-10-CM | POA: Diagnosis not present

## 2019-01-11 DIAGNOSIS — R7303 Prediabetes: Secondary | ICD-10-CM | POA: Diagnosis not present

## 2019-01-20 ENCOUNTER — Ambulatory Visit: Payer: BC Managed Care – PPO | Admitting: Podiatry

## 2019-01-25 DIAGNOSIS — G44209 Tension-type headache, unspecified, not intractable: Secondary | ICD-10-CM | POA: Diagnosis not present

## 2019-01-25 DIAGNOSIS — F339 Major depressive disorder, recurrent, unspecified: Secondary | ICD-10-CM | POA: Diagnosis not present

## 2019-01-25 DIAGNOSIS — I10 Essential (primary) hypertension: Secondary | ICD-10-CM | POA: Diagnosis not present

## 2019-01-30 ENCOUNTER — Ambulatory Visit: Payer: BC Managed Care – PPO | Admitting: Podiatry

## 2019-03-05 ENCOUNTER — Other Ambulatory Visit: Payer: Self-pay

## 2019-03-05 ENCOUNTER — Emergency Department (HOSPITAL_COMMUNITY): Payer: BC Managed Care – PPO

## 2019-03-05 ENCOUNTER — Emergency Department (HOSPITAL_COMMUNITY)
Admission: EM | Admit: 2019-03-05 | Discharge: 2019-03-05 | Disposition: A | Discharge status: Home / Self Care | Payer: BC Managed Care – PPO | Attending: Emergency Medicine | Admitting: Emergency Medicine

## 2019-03-05 DIAGNOSIS — R55 Syncope and collapse: Secondary | ICD-10-CM | POA: Insufficient documentation

## 2019-03-05 DIAGNOSIS — S0990XA Unspecified injury of head, initial encounter: Secondary | ICD-10-CM | POA: Diagnosis not present

## 2019-03-05 DIAGNOSIS — F17210 Nicotine dependence, cigarettes, uncomplicated: Secondary | ICD-10-CM | POA: Diagnosis not present

## 2019-03-05 DIAGNOSIS — I959 Hypotension, unspecified: Secondary | ICD-10-CM | POA: Diagnosis not present

## 2019-03-05 DIAGNOSIS — R569 Unspecified convulsions: Secondary | ICD-10-CM | POA: Insufficient documentation

## 2019-03-05 DIAGNOSIS — I10 Essential (primary) hypertension: Secondary | ICD-10-CM | POA: Insufficient documentation

## 2019-03-05 DIAGNOSIS — S299XXA Unspecified injury of thorax, initial encounter: Secondary | ICD-10-CM | POA: Diagnosis not present

## 2019-03-05 LAB — BASIC METABOLIC PANEL
Anion gap: 12 (ref 5–15)
BUN: 9 mg/dL (ref 6–20)
CO2: 21 mmol/L — ABNORMAL LOW (ref 22–32)
Calcium: 8.7 mg/dL — ABNORMAL LOW (ref 8.9–10.3)
Chloride: 103 mmol/L (ref 98–111)
Creatinine, Ser: 1.05 mg/dL — ABNORMAL HIGH (ref 0.44–1.00)
GFR calc Af Amer: >60 mL/min (ref >60)
GFR calc non Af Amer: >60 mL/min (ref >60)
Glucose, Bld: 109 mg/dL — ABNORMAL HIGH (ref 70–99)
Potassium: 3.1 mmol/L — ABNORMAL LOW (ref 3.5–5.1)
Sodium: 136 mmol/L (ref 135–145)

## 2019-03-05 LAB — CBC WITH DIFFERENTIAL/PLATELET
Abs Immature Granulocytes: 0.04 10*3/uL (ref 0.00–0.07)
Basophils Absolute: 0 10*3/uL (ref 0.0–0.1)
Basophils Relative: 1 %
Eosinophils Absolute: 0.1 10*3/uL (ref 0.0–0.5)
Eosinophils Relative: 2 %
HCT: 34.1 % — ABNORMAL LOW (ref 36.0–46.0)
Hemoglobin: 10.3 g/dL — ABNORMAL LOW (ref 12.0–15.0)
Immature Granulocytes: 1 %
Lymphocytes Relative: 49 %
Lymphs Abs: 3.5 10*3/uL (ref 0.7–4.0)
MCH: 24 pg — ABNORMAL LOW (ref 26.0–34.0)
MCHC: 30.2 g/dL (ref 30.0–36.0)
MCV: 79.5 fL — ABNORMAL LOW (ref 80.0–100.0)
Monocytes Absolute: 0.8 10*3/uL (ref 0.1–1.0)
Monocytes Relative: 12 %
Neutro Abs: 2.4 10*3/uL (ref 1.7–7.7)
Neutrophils Relative %: 35 %
Platelets: 339 10*3/uL (ref 150–400)
RBC: 4.29 MIL/uL (ref 3.87–5.11)
RDW: 15.9 % — ABNORMAL HIGH (ref 11.5–15.5)
WBC: 7 10*3/uL (ref 4.0–10.5)
nRBC: 0 % (ref 0.0–0.2)

## 2019-03-05 LAB — TROPONIN I (HIGH SENSITIVITY): Troponin I (High Sensitivity): 10 ng/L (ref <18)

## 2019-03-05 NOTE — Discharge Instructions (Signed)
Please call your primary care doctor to discuss your syncopal versus seizure episode.  Please discuss further testing such as MRI, EEG and echocardiogram.  If you have any further episodes of passing out, any seizure activity, please return to ER for reassessment.

## 2019-03-05 NOTE — ED Notes (Signed)
Patient Alert and oriented to baseline. Stable and ambulatory to baseline. Patient verbalized understanding of the discharge instructions.  Patient belongings were taken by the patient.   

## 2019-03-05 NOTE — ED Provider Notes (Signed)
[facility]   CSN: [id number] Arrival date & time: 03/05/19  1423     History Chief Complaint  Patient presents with  . Loss of Consciousness    Laurene FootmanJainon Cretella is a 48 y.o. female. Presents to the emergency department with chief complaint of loss of consciousness.  Patient states she was in her normal state of health, no no symptoms today.  States that she had a syncopal episode from sitting position.  Witnessed by her daughter, reports lasted about 20 seconds where she was out.  Patient states that she does not remember being confused and feels that she had recovered consciousness quickly.  She denies any head trauma.  She had no lip or tongue cuts, no bladder or bowel incontinence.  States her daughter described shaking.  She has no complaints at this time, specifically denies any chest pain, difficulty in breathing, no numbness, weakness, vision changes or speech changes.  HPI     Past Medical History:  Diagnosis Date  . Hypertension   . Hypokalemia     There are no problems to display for this patient.   Past Surgical History:  Procedure Laterality Date  . GASTRIC BYPASS       OB History   No obstetric history on file.     No family history on file.  Social History   Tobacco Use  . Smoking status: Current Every Day Smoker    Packs/day: 0.25    Types: Cigarettes  . Smokeless tobacco: Never Used  Substance Use Topics  . Alcohol use: Yes    Comment: rare  . Drug use: No    Home Medications Prior to Admission medications   Medication Sig Start Date End Date Taking? Authorizing Provider  acetaminophen (TYLENOL) 500 MG tablet Take 2 tablets (1,000 mg total) by mouth every 8 (eight) hours as needed. Patient not taking: Reported on 07/03/2018 04/10/18   Domenick GongMortenson, Ashley, MD  buPROPion (WELLBUTRIN XL) 150 MG 24 hr tablet Take 150 mg by mouth daily. 08/19/17   [provider]  dicyclomine (BENTYL) 20 MG  tablet Take 1 tablet (20 mg total) by mouth 2 (two) times daily. Patient not taking: Reported on 07/03/2018 09/12/17   Renne CriglerGeiple, Joshua, PA-C  ondansetron (ZOFRAN ODT) 8 MG disintegrating tablet 1/2- 1 tablet q 8 hr prn nausea, vomiting Patient not taking: Reported on 07/03/2018 04/10/18   Domenick GongMortenson, Ashley, MD  valsartan-hydrochlorothiazide (DIOVAN-HCT) 160-12.5 MG tablet Take 1 tablet by mouth daily. 08/10/18   [provider]    Allergies    Lisinopril  Review of Systems   Review of Systems  Constitutional: Negative for chills and fever.  HENT: Negative for ear pain and sore throat.   Eyes: Negative for pain and visual disturbance.  Respiratory: Negative for cough and shortness of breath.   Cardiovascular: Negative for chest pain and palpitations.  Gastrointestinal: Negative for abdominal pain and vomiting.  Genitourinary: Negative for dysuria and hematuria.  Musculoskeletal: Negative for arthralgias and back pain.  Skin: Negative for color change and rash.  Neurological: Positive for seizures and syncope.  All other systems reviewed and are negative.   Physical Exam Updated Vital Signs BP (!) 153/87 (BP Location: Right Arm)   Pulse 97   Temp 98.8 F (37.1 C) (Oral)   Resp 14   Ht 5\' 1"  (1.549 m)   Wt 86.2 kg   SpO2 100%   BMI 35.90 kg/m   Physical Exam Vitals and nursing note reviewed.  Constitutional:      General: She is not in acute distress.    Appearance: She is well-developed.  HENT:     Head: Normocephalic and atraumatic.  Eyes:     Conjunctiva/sclera: Conjunctivae normal.  Cardiovascular:     Rate and Rhythm: Normal rate and regular rhythm.     Heart sounds: No murmur.  Pulmonary:     Effort: Pulmonary effort is normal. No respiratory distress.     Breath sounds: Normal breath sounds.  Abdominal:     Palpations: Abdomen is soft.     Tenderness: There is no abdominal tenderness.  Musculoskeletal:     Cervical back: Neck supple.  Skin:    General:  Skin is warm and dry.     Capillary Refill: Capillary refill takes less than 2 seconds.  Neurological:     Mental Status: She is alert.     Comments: Alert, oriented x3, normal finger-nose-finger, normal strength in bilateral upper and lower extremities, sensation to light touch intact in all 4 extremities, no pronator drift, normal gait, cranial nerves II through XII intact, normal visual field  Psychiatric:        Mood and Affect: Mood normal.        Behavior: Behavior normal.     ED Results / Procedures / Treatments   Labs (all labs ordered are listed, but only abnormal results are displayed) Labs Reviewed  CBC WITH DIFFERENTIAL/PLATELET - Abnormal; Notable for the following components:      Result Value   Hemoglobin 10.3 (*)    HCT 34.1 (*)    MCV 79.5 (*)    MCH 24.0 (*)    RDW 15.9 (*)    All other components within normal limits  BASIC METABOLIC PANEL - Abnormal; Notable for the following components:   Potassium 3.1 (*)    CO2 21 (*)    Glucose, Bld 109 (*)    Creatinine, Ser 1.05 (*)    Calcium 8.7 (*)    All other components within normal limits  TROPONIN I (HIGH SENSITIVITY)  TROPONIN I (HIGH SENSITIVITY)    EKG EKG Interpretation  Date/Time:  Sunday March 05 2019 14:30:32 EST Ventricular Rate:  96 PR Interval:    QRS Duration: 98 QT Interval:  378 QTC Calculation: 478 R Axis:   6 Text Interpretation: Sinus rhythm Probable left ventricular hypertrophy Confirmed by Marianna Fuss ([id number]) on 03/05/2019 5:39:42 PM   Radiology DG Chest 2 View  Result Date: 03/05/2019 CLINICAL DATA:  48 year old female status post fall with shaking and temporarily altered mental status. EXAM: CHEST - 2 VIEW COMPARISON:  None. FINDINGS: The lungs are clear and negative for focal airspace consolidation, pulmonary edema or suspicious pulmonary nodule. No pleural effusion or pneumothorax. Cardiac and mediastinal contours are within normal limits. No acute fracture or lytic or  blastic osseous lesions. The visualized upper abdominal bowel gas pattern is unremarkable. IMPRESSION: Negative chest x-ray. Electronically Signed   By: Malachy Moan M.D.   On: 03/05/2019 15:26   CT Head Wo Contrast  Result Date: 03/05/2019 CLINICAL DATA:  Status post fall. Seizure. EXAM: CT HEAD WITHOUT CONTRAST TECHNIQUE: Contiguous axial images were obtained from the base of the skull through the vertex without intravenous contrast. COMPARISON:  None. FINDINGS: Brain: No evidence of acute infarction, hemorrhage, hydrocephalus, extra-axial collection or mass lesion/mass effect. Vascular: No hyperdense vessel is noted. Skull: Normal. Negative for fracture or focal lesion. Sinuses/Orbits: No acute finding. Other: None. IMPRESSION: No focal acute intracranial abnormality identified.  Electronically Signed   By: Abelardo Diesel M.D.   On: 03/05/2019 16:12    Procedures Procedures (including critical care time)  Medications Ordered in ED Medications - No data to display  ED Course  I have reviewed the triage vital signs and the nursing notes.  Pertinent labs & imaging results that were available during my care of the patient were reviewed by me and considered in my medical decision making (see chart for details).    MDM Rules/Calculators/A&P                      48 year old lady presents to ER after syncopal versus seizure episode.  Here patient is noted be well-appearing, no ongoing symptoms.  No arrhythmias on telemetry monitoring, EKG without acute changes.  Electrolytes within normal limits, blood counts within normal limits.  CXR negative.  CT head was negative. No neuro complaint. Suspect episode most likely was a syncopal episode with some seizure-like activity, but believe it would be prudent for patient to have close primary care follow-up and consideration for outpatient MRI, EEG and echo.  Discussed these recommendations in detail with patient.  Discussed strict return precautions in  detail with patient.  Believe she is appropriate for discharge and outpatient management at this time.  After the discussed management above, the patient was determined to be safe for discharge.  The patient was in agreement with this plan and all questions regarding their care were answered.  ED return precautions were discussed and the patient will return to the ED with any significant worsening of condition.   Final Clinical Impression(s) / ED Diagnoses Final diagnoses:  Syncope and collapse  Seizure-like activity [facility])    Rx / DC Orders ED Discharge Orders    None       Lucrezia Starch, MD 03/05/19 2127

## 2019-03-05 NOTE — ED Triage Notes (Signed)
BIB EMS from home. Pt fell from sitting position, daughter reports about 20 seconds of shaking. No history of seizures. EMS reports pt was confused on arrival but resolved en route. Pt is now A&O x4. Does not remember fall. Pt has no complaints

## 2019-05-13 DIAGNOSIS — I10 Essential (primary) hypertension: Secondary | ICD-10-CM | POA: Diagnosis not present

## 2019-05-13 DIAGNOSIS — E669 Obesity, unspecified: Secondary | ICD-10-CM | POA: Diagnosis not present

## 2019-05-13 DIAGNOSIS — R7309 Other abnormal glucose: Secondary | ICD-10-CM | POA: Diagnosis not present

## 2019-05-13 DIAGNOSIS — F339 Major depressive disorder, recurrent, unspecified: Secondary | ICD-10-CM | POA: Diagnosis not present

## 2019-05-23 DIAGNOSIS — Z20822 Contact with and (suspected) exposure to covid-19: Secondary | ICD-10-CM | POA: Diagnosis not present

## 2019-05-25 DIAGNOSIS — R112 Nausea with vomiting, unspecified: Secondary | ICD-10-CM | POA: Diagnosis not present

## 2019-07-24 DIAGNOSIS — R7303 Prediabetes: Secondary | ICD-10-CM | POA: Diagnosis not present

## 2019-07-24 DIAGNOSIS — E6609 Other obesity due to excess calories: Secondary | ICD-10-CM | POA: Diagnosis not present

## 2019-07-24 DIAGNOSIS — Z720 Tobacco use: Secondary | ICD-10-CM | POA: Diagnosis not present

## 2019-07-24 DIAGNOSIS — I10 Essential (primary) hypertension: Secondary | ICD-10-CM | POA: Diagnosis not present

## 2019-12-02 DIAGNOSIS — I10 Essential (primary) hypertension: Secondary | ICD-10-CM | POA: Diagnosis not present

## 2019-12-02 DIAGNOSIS — E669 Obesity, unspecified: Secondary | ICD-10-CM | POA: Diagnosis not present

## 2020-04-19 IMAGING — CT CT ABD-PELV W/ CM
2 of 5 series · 16 of 46 positions shown, 18 images · IV contrast (APPLIED)
Comparison: None.

CLINICAL DATA: Lower-mid abdominal burning and cramping for 3 days
with nausea and diarrhea. Cramping radiates to back intermittently.
Bloody stools.

EXAM:
CT ABDOMEN AND PELVIS WITH CONTRAST
TECHNIQUE: Multidetector CT imaging of the abdomen and pelvis was performed
using the standard protocol following bolus administration of
intravenous contrast.
CONTRAST:  100mL OMNIPAQUE IOHEXOL 300 MG/ML  SOLN

[Series 3: abd/ pelvis 5.0 i30f 2 · axial · 0.93mm/px · z∈[+1018,+1393]mm · 13 of 86 slices shown, 15 images]
[im 6/86  soft-tissue]
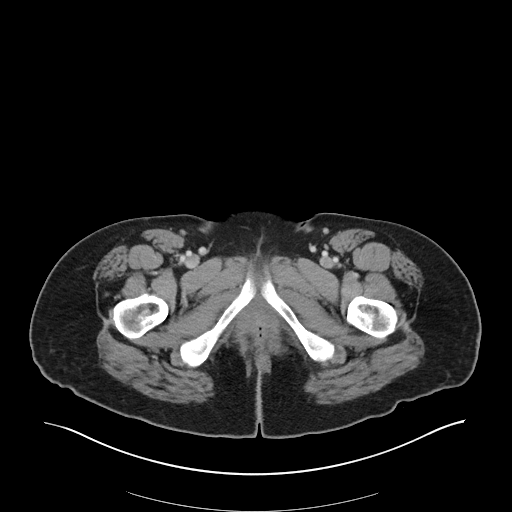
[im 6/86  bone]
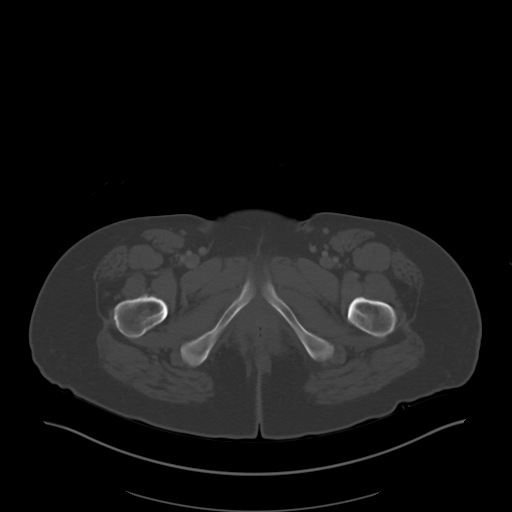
[im 11/86  soft-tissue]
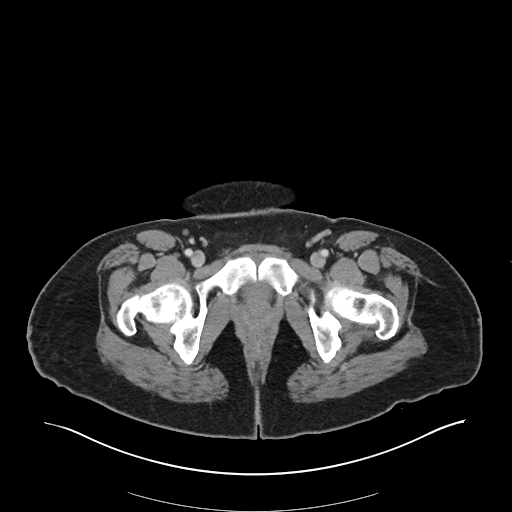
[im 21/86  soft-tissue]
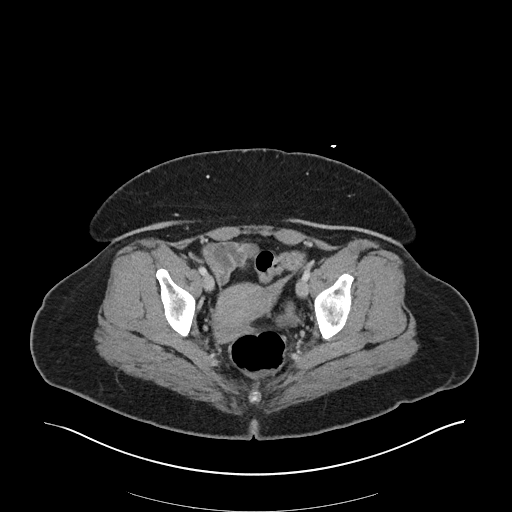
[im 26/86  soft-tissue]
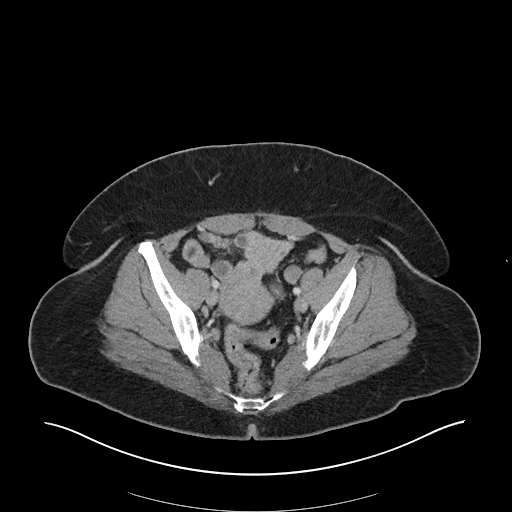
[im 31/86  soft-tissue]
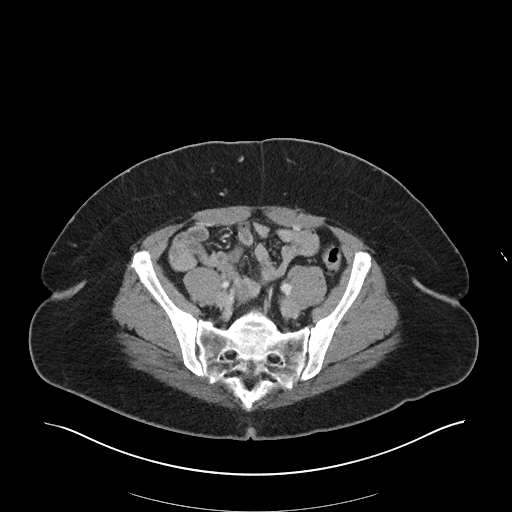
[im 36/86  soft-tissue]
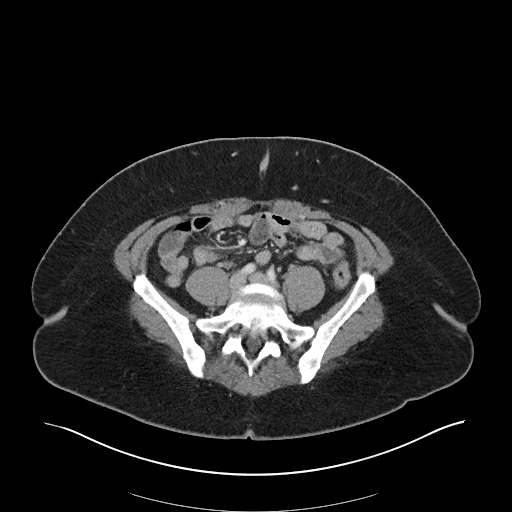
[im 46/86  soft-tissue]
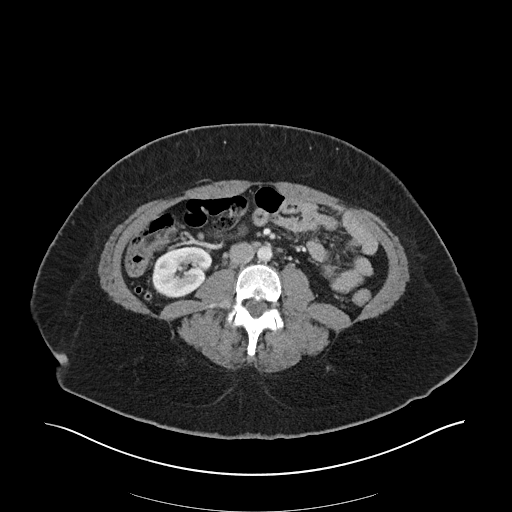
[im 51/86  soft-tissue]
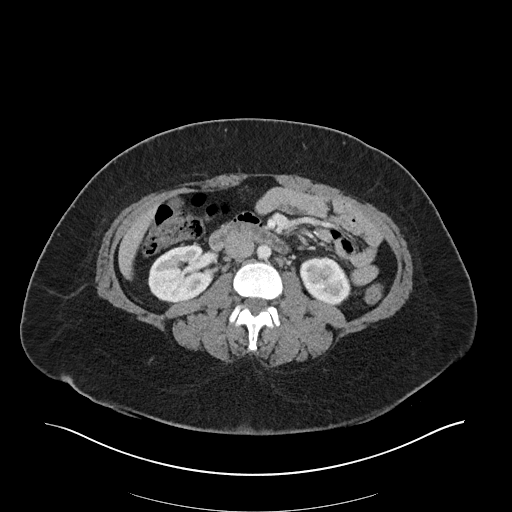
[im 56/86  soft-tissue]
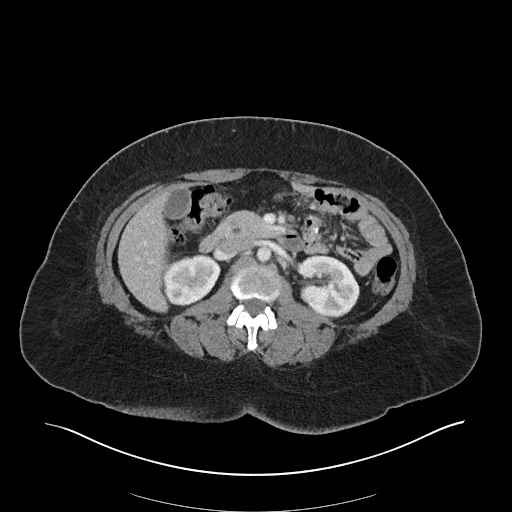
[im 56/86  bone]
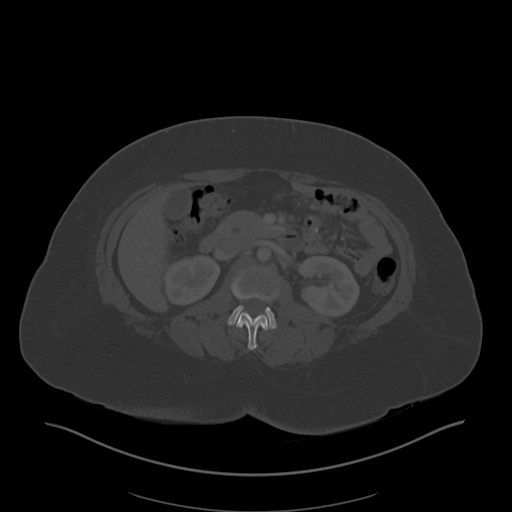
[im 61/86  soft-tissue]
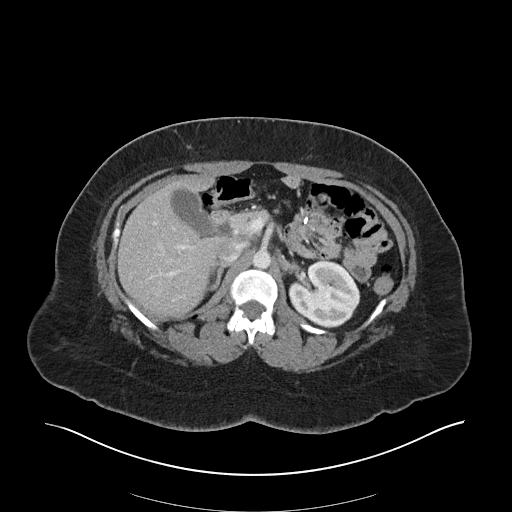
[im 66/86  soft-tissue]
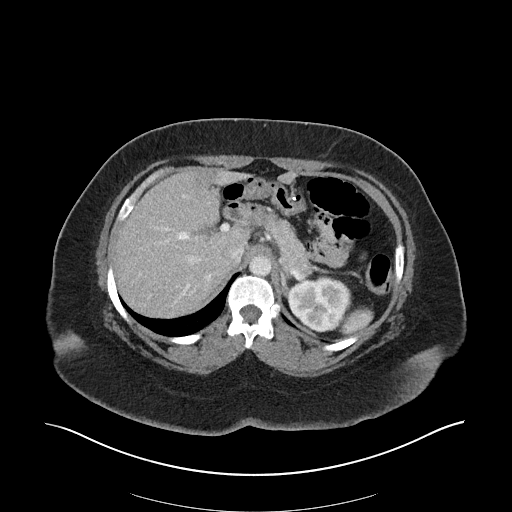
[im 76/86  soft-tissue]
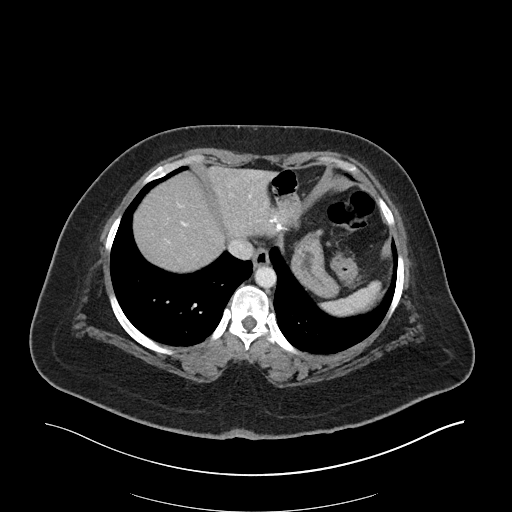
[im 81/86  soft-tissue]
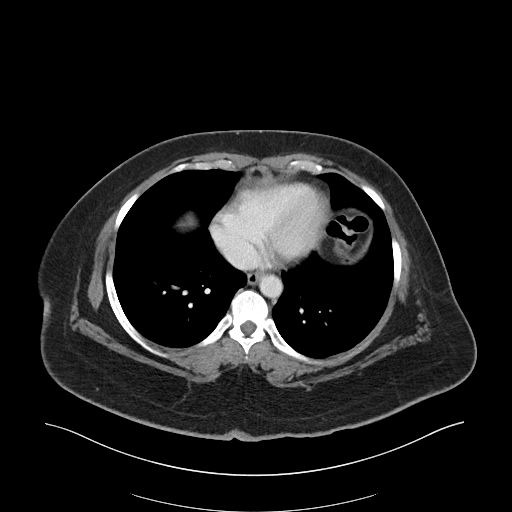

[Series 6: coronal soft tissue · coronal · 0.83mm/px · 3 of 107 slices shown]
[im 36/107  soft-tissue]
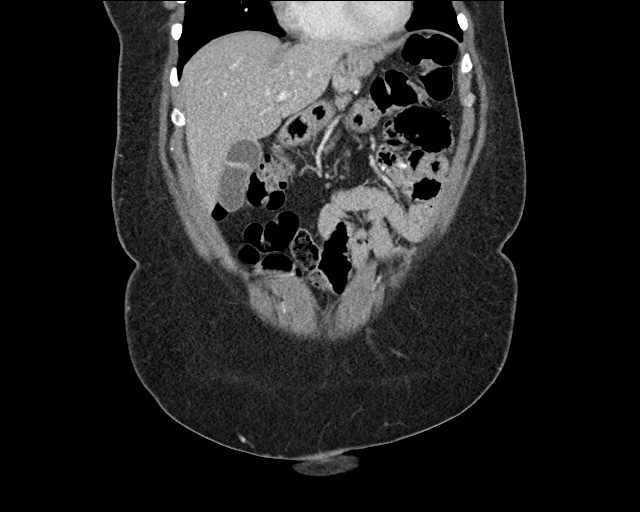
[im 48/107  soft-tissue]
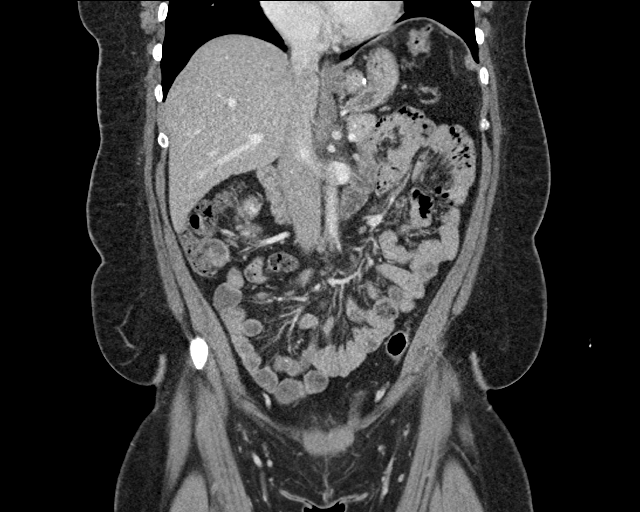
[im 59/107  soft-tissue]
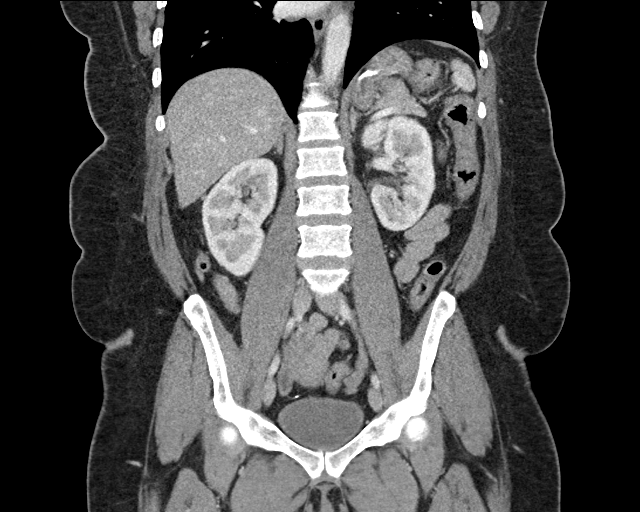

[16 of 46 positions shown; findings below may reference images not displayed]

FINDINGS: Lower chest: No acute abnormality.

Hepatobiliary: No acute or suspicious liver abnormality is seen.
Focal fatty infiltration within the LEFT liver lobe. No gallstones,
gallbladder wall thickening, or biliary dilatation.

Pancreas: Unremarkable. No pancreatic ductal dilatation or
surrounding inflammatory changes.

Spleen: Normal in size without focal abnormality.

Adrenals/Urinary Tract: Kidneys are unremarkable without suspicious
mass, stone or hydronephrosis. Adrenal glands appear normal. No
ureteral or bladder calculi identified. Bladder appears normal,
partially decompressed.

Stomach/Bowel: No dilated large or small bowel loops. No bowel wall
thickening or evidence of bowel wall inflammation. Appendix is
normal. Surgical changes of gastric bypass, without obstruction or
other surgical complicating feature.

Vascular/Lymphatic: No significant vascular findings are present. No
enlarged abdominal or pelvic lymph nodes.

Reproductive: Uterus and bilateral adnexa are unremarkable.

Other: No free fluid or abscess collection. No free intraperitoneal
air.

Musculoskeletal: No acute or suspicious osseous finding.
IMPRESSION: 1. No acute findings within the abdomen or pelvis. No bowel
obstruction or evidence of bowel wall inflammation. No free fluid.
No free intraperitoneal air. No renal or ureteral calculi. Appendix
is normal.
2. Surgical changes of gastric bypass.
